# Patient Record
Sex: Male | Born: 1960 | Race: White | Hispanic: No | Smoking: Former smoker
Health system: Southern US, Community
[De-identification: ages and names within clinical notes are randomized; demographics above are authoritative.]

## PROBLEM LIST (undated history)

## (undated) ENCOUNTER — Ambulatory Visit

## (undated) DIAGNOSIS — K759 Inflammatory liver disease, unspecified: Secondary | ICD-10-CM

## (undated) DIAGNOSIS — R079 Chest pain, unspecified: Secondary | ICD-10-CM

## (undated) DIAGNOSIS — E785 Hyperlipidemia, unspecified: Secondary | ICD-10-CM

## (undated) DIAGNOSIS — J189 Pneumonia, unspecified organism: Secondary | ICD-10-CM

## (undated) DIAGNOSIS — C9210 Chronic myeloid leukemia, BCR/ABL-positive, not having achieved remission: Secondary | ICD-10-CM

## (undated) DIAGNOSIS — I10 Essential (primary) hypertension: Secondary | ICD-10-CM

## (undated) DIAGNOSIS — N182 Chronic kidney disease, stage 2 (mild): Secondary | ICD-10-CM

## (undated) DIAGNOSIS — I2510 Atherosclerotic heart disease of native coronary artery without angina pectoris: Secondary | ICD-10-CM

## (undated) HISTORY — PX: OTHER SURGICAL HISTORY: SHX169

## (undated) HISTORY — DX: Atherosclerotic heart disease of native coronary artery without angina pectoris: I25.10

---

## 2002-06-12 ENCOUNTER — Ambulatory Visit (HOSPITAL_COMMUNITY): Admission: RE | Admit: 2002-06-12 | Discharge: 2002-06-12 | Payer: Self-pay | Admitting: Orthopedic Surgery

## 2002-06-12 ENCOUNTER — Encounter: Payer: Self-pay | Admitting: Orthopedic Surgery

## 2002-07-30 ENCOUNTER — Encounter: Admission: RE | Admit: 2002-07-30 | Discharge: 2002-07-30 | Payer: Self-pay | Admitting: Orthopedic Surgery

## 2002-07-30 ENCOUNTER — Encounter: Payer: Self-pay | Admitting: Orthopedic Surgery

## 2006-02-26 ENCOUNTER — Ambulatory Visit: Payer: Self-pay | Admitting: Internal Medicine

## 2006-02-26 ENCOUNTER — Observation Stay (HOSPITAL_COMMUNITY): Admission: EM | Admit: 2006-02-26 | Discharge: 2006-02-27 | Payer: Self-pay | Admitting: Emergency Medicine

## 2010-04-13 ENCOUNTER — Other Ambulatory Visit: Payer: Self-pay | Admitting: Oncology

## 2010-04-13 ENCOUNTER — Other Ambulatory Visit (HOSPITAL_COMMUNITY)
Admission: RE | Admit: 2010-04-13 | Discharge: 2010-04-13 | Disposition: A | Discharge status: Home / Self Care | Payer: BC Managed Care – PPO | Source: Ambulatory Visit | Attending: Oncology | Admitting: Oncology

## 2010-04-13 DIAGNOSIS — D72829 Elevated white blood cell count, unspecified: Secondary | ICD-10-CM | POA: Insufficient documentation

## 2012-01-30 ENCOUNTER — Emergency Department (HOSPITAL_COMMUNITY)
Admission: EM | Admit: 2012-01-30 | Discharge: 2012-01-30 | Disposition: A | Discharge status: Home / Self Care | Payer: 59 | Attending: Emergency Medicine | Admitting: Emergency Medicine

## 2012-01-30 ENCOUNTER — Emergency Department (HOSPITAL_COMMUNITY): Payer: 59

## 2012-01-30 ENCOUNTER — Encounter (HOSPITAL_COMMUNITY): Payer: Self-pay | Admitting: Emergency Medicine

## 2012-01-30 DIAGNOSIS — R0602 Shortness of breath: Secondary | ICD-10-CM | POA: Insufficient documentation

## 2012-01-30 DIAGNOSIS — R5381 Other malaise: Secondary | ICD-10-CM | POA: Insufficient documentation

## 2012-01-30 DIAGNOSIS — C9210 Chronic myeloid leukemia, BCR/ABL-positive, not having achieved remission: Secondary | ICD-10-CM | POA: Insufficient documentation

## 2012-01-30 DIAGNOSIS — E7800 Pure hypercholesterolemia, unspecified: Secondary | ICD-10-CM | POA: Insufficient documentation

## 2012-01-30 DIAGNOSIS — R079 Chest pain, unspecified: Secondary | ICD-10-CM

## 2012-01-30 DIAGNOSIS — I10 Essential (primary) hypertension: Secondary | ICD-10-CM | POA: Insufficient documentation

## 2012-01-30 DIAGNOSIS — R072 Precordial pain: Secondary | ICD-10-CM

## 2012-01-30 DIAGNOSIS — Z79899 Other long term (current) drug therapy: Secondary | ICD-10-CM | POA: Insufficient documentation

## 2012-01-30 HISTORY — DX: Chronic myeloid leukemia, BCR/ABL-positive, not having achieved remission: C92.10

## 2012-01-30 HISTORY — DX: Hyperlipidemia, unspecified: E78.5

## 2012-01-30 HISTORY — DX: Chest pain, unspecified: R07.9

## 2012-01-30 HISTORY — DX: Essential (primary) hypertension: I10

## 2012-01-30 LAB — POCT I-STAT TROPONIN I: Troponin i, poc: 0 ng/mL (ref 0.00–0.08)

## 2012-01-30 LAB — CBC WITH DIFFERENTIAL/PLATELET
Basophils Absolute: 0.1 10*3/uL (ref 0.0–0.1)
Basophils Relative: 1 % (ref 0–1)
Eosinophils Absolute: 0.3 10*3/uL (ref 0.0–0.7)
Eosinophils Relative: 4 % (ref 0–5)
HCT: 45 % (ref 39.0–52.0)
Hemoglobin: 15.7 g/dL (ref 13.0–17.0)
Lymphocytes Relative: 32 % (ref 12–46)
Lymphs Abs: 2.2 K/uL (ref 0.7–4.0)
MCH: 29.8 pg (ref 26.0–34.0)
MCHC: 34.9 g/dL (ref 30.0–36.0)
MCV: 85.4 fL (ref 78.0–100.0)
Monocytes Absolute: 0.7 K/uL (ref 0.1–1.0)
Monocytes Relative: 10 % (ref 3–12)
Neutro Abs: 3.7 K/uL (ref 1.7–7.7)
Neutrophils Relative %: 53 % (ref 43–77)
Platelets: 146 10*3/uL — ABNORMAL LOW (ref 150–400)
RBC: 5.27 MIL/uL (ref 4.22–5.81)
RDW: 14.7 % (ref 11.5–15.5)
WBC: 6.9 K/uL (ref 4.0–10.5)

## 2012-01-30 LAB — COMPREHENSIVE METABOLIC PANEL WITH GFR
Albumin: 4.4 g/dL (ref 3.5–5.2)
Alkaline Phosphatase: 73 U/L (ref 39–117)
BUN: 16 mg/dL (ref 6–23)
CO2: 25 meq/L (ref 19–32)
Chloride: 106 meq/L (ref 96–112)
Creatinine, Ser: 1.13 mg/dL (ref 0.50–1.35)
GFR calc non Af Amer: 73 mL/min — ABNORMAL LOW (ref >90)
Potassium: 3.7 meq/L (ref 3.5–5.1)
Total Bilirubin: 0.6 mg/dL (ref 0.3–1.2)

## 2012-01-30 LAB — COMPREHENSIVE METABOLIC PANEL
ALT: 25 U/L (ref 0–53)
AST: 32 U/L (ref 0–37)
Calcium: 9.5 mg/dL (ref 8.4–10.5)
GFR calc Af Amer: 84 mL/min — ABNORMAL LOW (ref >90)
Glucose, Bld: 91 mg/dL (ref 70–99)
Sodium: 140 mEq/L (ref 135–145)
Total Protein: 7.5 g/dL (ref 6.0–8.3)

## 2012-01-30 MED ORDER — POTASSIUM CHLORIDE ER 10 MEQ PO TBCR: 20.0000 meq | EXTENDED_RELEASE_TABLET | Freq: Every day | ORAL | Status: DC

## 2012-01-30 MED ORDER — HYDROCHLOROTHIAZIDE 25 MG PO TABS: 25.0000 mg | ORAL_TABLET | Freq: Every day | ORAL | Status: DC

## 2012-01-30 MED ORDER — ASPIRIN 81 MG PO CHEW
CHEWABLE_TABLET | ORAL | Status: AC
  Filled 2012-01-30: qty 1

## 2012-01-30 MED ORDER — ASPIRIN 81 MG PO CHEW
324.0000 mg | CHEWABLE_TABLET | Freq: Once | ORAL | Status: AC
  Administered 2012-01-30: 324 mg via ORAL
  Filled 2012-01-30: qty 4

## 2012-01-30 NOTE — ED Notes (Signed)
Correction to departure vitals--blood pressure done on left arm while sitting
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
## 2012-01-30 NOTE — ED Provider Notes (Signed)
History     CSN: [id number]  Arrival date & time 01/30/12  1047   First MD Initiated Contact with Patient 01/30/12 1133      Chief Complaint  Patient presents with  . Chest Pain    (Consider location/radiation/quality/duration/timing/severity/associated sxs/prior treatment) HPI Comments: Patient with h/o CML for 2 years (on dasatinib), cath in 2008 with no stents placed -- presents with complaints of high blood pressure for the past 4 days with associated shortness of breath, fatigue, chest pain. Chest pain is intermittent lasting from 1 to 4 minutes however there is a pressure that is more persistent. Pain/pressure does not radiate. It is not associated with activity or movement. Patient states that he has mild shortness of breath with activity and also generalized fatigue. No fever, N/V/D, cough, abdominal pain. No treatments PTA. Onset gradual, course constant. Nothing makes symptoms better or worse.   Patient is a 51 y.o. male presenting with chest pain. The history is provided by the patient.  Chest Pain Primary symptoms include shortness of breath. Pertinent negatives for primary symptoms include no fever, no cough, no palpitations, no abdominal pain, no nausea and no vomiting.  Pertinent negatives for associated symptoms include no diaphoresis.     Past Medical History  Diagnosis Date  . Hypertension   . Leukemia   . High cholesterol     History reviewed. No pertinent past surgical history.  No family history on file.  History  Substance Use Topics  . Smoking status: Never Smoker   . Smokeless tobacco: Not on file  . Alcohol Use: Not on file      Review of Systems  Constitutional: Negative for fever and diaphoresis.  HENT: Negative for neck pain.   Eyes: Negative for redness.  Respiratory: Positive for shortness of breath. Negative for cough.   Cardiovascular: Positive for chest pain. Negative for palpitations and leg swelling.  Gastrointestinal: Negative for  nausea, vomiting and abdominal pain.  Genitourinary: Negative for dysuria.  Musculoskeletal: Negative for back pain.  Skin: Negative for rash.  Neurological: Negative for syncope and light-headedness.    Allergies  Review of patient's allergies indicates no known allergies.  Home Medications   Current Outpatient Rx  Name  Route  Sig  Dispense  Refill  . ATENOLOL 25 MG PO TABS   Oral   Take 25 mg by mouth daily.         Marland Kitchen DASATINIB 100 MG PO TABS   Oral   Take 100 mg by mouth daily.         Marland Kitchen PRAVASTATIN SODIUM 20 MG PO TABS   Oral   Take 20 mg by mouth daily.           BP 157/116  Pulse 69  Temp 98.1 F (36.7 C)  Resp 20  SpO2 99%  Physical Exam  Nursing note and vitals reviewed. Constitutional: He appears well-developed and well-nourished.  HENT:  Head: Normocephalic and atraumatic.  Mouth/Throat: Mucous membranes are normal. Mucous membranes are not dry.  Eyes: Conjunctivae normal are normal.  Neck: Trachea normal and normal range of motion. Neck supple. Normal carotid pulses and no JVD present. No muscular tenderness present. Carotid bruit is not present. No tracheal deviation present.  Cardiovascular: Normal rate, regular rhythm, S1 normal, S2 normal, normal heart sounds and intact distal pulses.  Exam reveals no distant heart sounds and no decreased pulses.   No murmur heard. Pulmonary/Chest: Effort normal and breath sounds normal. No respiratory distress. He has no  wheezes. He exhibits no tenderness.  Abdominal: Soft. Normal aorta and bowel sounds are normal. There is no tenderness. There is no rebound and no guarding.  Musculoskeletal: He exhibits no edema.  Neurological: He is alert.  Skin: Skin is warm and dry. He is not diaphoretic. No cyanosis. No pallor.  Psychiatric: He has a normal mood and affect.    ED Course  Procedures (including critical care time)  Labs Reviewed  CBC WITH DIFFERENTIAL - Abnormal; Notable for the following:     Platelets 146 (*)     All other components within normal limits  COMPREHENSIVE METABOLIC PANEL - Abnormal; Notable for the following:    GFR calc non Af Amer 73 (*)     GFR calc Af Amer 84 (*)     All other components within normal limits  POCT I-STAT TROPONIN I   Dg Chest 2 View  01/30/2012  *RADIOLOGY REPORT*  Clinical Data: Shortness of breath for 5 days  CHEST - 2 VIEW  Comparison: 12/16/2010  Findings: The cardiac silhouette is normal in size and configuration.  The aorta is mildly uncoiled.  No mediastinal or hilar masses or adenopathy.  The lungs are clear.  The bony thorax is intact.  IMPRESSION: No active disease of the chest.   Original Report Authenticated By: Amie Portland, M.D.      1. Chest pain     11:45 AM Patient seen and examined. Work-up initiated. Medications ordered.   Vital signs reviewed and are as follows: Filed Vitals:   01/30/12 1113  BP: 157/116  Pulse: 69  Temp: 98.1 F (36.7 C)  Resp: 20   1:25 PM Patient discussed with and seen by Dr. Oletta Lamas -- will call for cardiology consult.   [place] has seen and scheduled Myoview test tomorrow. Will d/c to home.   3:52 PM Patient was counseled to return with severe chest pain, especially if the pain is crushing or pressure-like and spreads to the arms, back, neck, or jaw, or if they have sweating, nausea, or shortness of breath with the pain. They were encouraged to call 911 with these symptoms.   They were also told to return if their chest pain gets worse and does not go away with rest, they have an attack of chest pain lasting longer than usual despite rest and treatment with the medications their caregiver has prescribed, if they wake from sleep with chest pain or shortness of breath, if they feel dizzy or faint, if they have chest pain not typical of their usual pain, or if they have any other emergent concerns regarding their health.  The patient verbalized understanding and agreed.     MDM  CP - [place]  has seen and set up Myoview tomorrow.         Renne Crigler, [place] 01/30/12 [phone]

## 2012-01-30 NOTE — ED Notes (Signed)
Dr. Oletta Lamas and Felicita Gage, PA at bedside.

## 2012-01-30 NOTE — ED Notes (Signed)
Cardiology at bedside.

## 2012-01-30 NOTE — ED Notes (Signed)
Pt denies chest pain; pt states a little shortness of breath; pt states chest pressure 1/10.

## 2012-01-30 NOTE — ED Notes (Signed)
Pt ambulatory leaving ED with prescriptions and d/c instructions; pt has no further questions upon d/c. Pt does not appear to be in any acute distress upon d/c

## 2012-01-30 NOTE — ED Notes (Signed)
Tightness in chest andsob has beenhaving high bp  For the past 4 days
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
## 2012-01-30 NOTE — ED Notes (Signed)
Pt denies chest pain and pressure/tightness. Pt denies shortness of breath. Pt denies nausea, lightheadedness and dizziness.

## 2012-01-30 NOTE — ED Notes (Signed)
Pt denies chest pain and chest pressure. Pt denies shortness of breath.

## 2012-01-30 NOTE — ED Notes (Signed)
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 

## 2012-01-30 NOTE — ED Notes (Signed)
Pt currently denies chest pain and chest pressure. Pt denies shortness of breath.
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
## 2012-01-30 NOTE — ED Provider Notes (Signed)
Medical screening examination/treatment/procedure(s) were conducted as a shared visit with non-physician practitioner(s) and myself.  I personally evaluated the patient during the encounter   Pt with h/o cath 7 years ago, reports verbally that a small artery was blocked, but too small to intervene on, has not followed up with a cardiologist since.  Pt with last 4 days, has CP and SOB, mild nausea and weakness due to CP that can be tightness, lasting minutes to about 1 hour, will come on with exertion, but at times at rest as well.  No pain now.  ECG shows no STEMI.  Troponin is neg, asa given.  Will consult [place] cardiology to see, possibly admit for functional study.  Pt with risk factors of HTN, hyperlipidemia.      Gavin Pound. Rochelle Nephew, MD 01/30/12 [phone]

## 2012-01-31 ENCOUNTER — Ambulatory Visit (HOSPITAL_COMMUNITY): Payer: 59 | Attending: Cardiology | Admitting: Radiology

## 2012-01-31 VITALS — BP 118/81 | Ht 71.0 in | Wt 210.0 lb

## 2012-01-31 DIAGNOSIS — R079 Chest pain, unspecified: Secondary | ICD-10-CM | POA: Insufficient documentation

## 2012-01-31 DIAGNOSIS — R110 Nausea: Secondary | ICD-10-CM | POA: Insufficient documentation

## 2012-01-31 DIAGNOSIS — I2510 Atherosclerotic heart disease of native coronary artery without angina pectoris: Secondary | ICD-10-CM

## 2012-01-31 DIAGNOSIS — R0602 Shortness of breath: Secondary | ICD-10-CM | POA: Insufficient documentation

## 2012-01-31 MED ORDER — TECHNETIUM TC 99M SESTAMIBI GENERIC - CARDIOLITE
30.0000 | Freq: Once | INTRAVENOUS | Status: AC | PRN
  Administered 2012-01-31: 30 via INTRAVENOUS

## 2012-01-31 MED ORDER — TECHNETIUM TC 99M SESTAMIBI GENERIC - CARDIOLITE
10.0000 | Freq: Once | INTRAVENOUS | Status: AC | PRN
  Administered 2012-01-31: 10 via INTRAVENOUS
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
## 2012-02-14 ENCOUNTER — Encounter: Payer: Self-pay | Admitting: *Deleted

## 2012-02-15 ENCOUNTER — Encounter: Payer: Self-pay | Admitting: Physician Assistant

## 2012-02-15 ENCOUNTER — Encounter (HOSPITAL_COMMUNITY): Payer: Self-pay | Admitting: General Practice

## 2012-02-15 ENCOUNTER — Observation Stay (HOSPITAL_COMMUNITY)
Admission: AD | Admit: 2012-02-15 | Discharge: 2012-02-16 | Disposition: A | Discharge status: Home / Self Care | Payer: 59 | Source: Ambulatory Visit | Attending: Cardiology | Admitting: Cardiology

## 2012-02-15 ENCOUNTER — Ambulatory Visit (INDEPENDENT_AMBULATORY_CARE_PROVIDER_SITE_OTHER): Payer: 59 | Admitting: Physician Assistant

## 2012-02-15 VITALS — BP 136/84 | HR 63 | Ht 71.0 in | Wt 215.0 lb

## 2012-02-15 DIAGNOSIS — I200 Unstable angina: Secondary | ICD-10-CM

## 2012-02-15 DIAGNOSIS — R072 Precordial pain: Secondary | ICD-10-CM | POA: Diagnosis present

## 2012-02-15 DIAGNOSIS — R079 Chest pain, unspecified: Secondary | ICD-10-CM

## 2012-02-15 DIAGNOSIS — E785 Hyperlipidemia, unspecified: Secondary | ICD-10-CM | POA: Insufficient documentation

## 2012-02-15 DIAGNOSIS — I10 Essential (primary) hypertension: Secondary | ICD-10-CM

## 2012-02-15 DIAGNOSIS — C9210 Chronic myeloid leukemia, BCR/ABL-positive, not having achieved remission: Secondary | ICD-10-CM

## 2012-02-15 DIAGNOSIS — I2510 Atherosclerotic heart disease of native coronary artery without angina pectoris: Secondary | ICD-10-CM

## 2012-02-15 DIAGNOSIS — I25118 Atherosclerotic heart disease of native coronary artery with other forms of angina pectoris: Secondary | ICD-10-CM

## 2012-02-15 DIAGNOSIS — R9439 Abnormal result of other cardiovascular function study: Secondary | ICD-10-CM | POA: Insufficient documentation

## 2012-02-15 HISTORY — DX: Pneumonia, unspecified organism: J18.9

## 2012-02-15 HISTORY — DX: Inflammatory liver disease, unspecified: K75.9

## 2012-02-15 LAB — COMPREHENSIVE METABOLIC PANEL
ALT: 31 U/L (ref 0–53)
AST: 37 U/L (ref 0–37)
Albumin: 4.4 g/dL (ref 3.5–5.2)
Calcium: 9.9 mg/dL (ref 8.4–10.5)
Creatinine, Ser: 1.24 mg/dL (ref 0.50–1.35)
GFR calc non Af Amer: 65 mL/min — ABNORMAL LOW (ref >90)
Sodium: 140 mEq/L (ref 135–145)
Total Protein: 7.5 g/dL (ref 6.0–8.3)

## 2012-02-15 LAB — CBC WITH DIFFERENTIAL/PLATELET
Basophils Absolute: 0.1 10*3/uL (ref 0.0–0.1)
Basophils Relative: 1 % (ref 0–1)
Eosinophils Absolute: 0.3 10*3/uL (ref 0.0–0.7)
Eosinophils Relative: 5 % (ref 0–5)
HCT: 43.2 % (ref 39.0–52.0)
Lymphocytes Relative: 28 % (ref 12–46)
MCHC: 36.1 g/dL — ABNORMAL HIGH (ref 30.0–36.0)
MCV: 85 fL (ref 78.0–100.0)
Monocytes Absolute: 0.5 10*3/uL (ref 0.1–1.0)
Platelets: 185 10*3/uL (ref 150–400)
RDW: 14.3 % (ref 11.5–15.5)
WBC: 6.3 10*3/uL (ref 4.0–10.5)

## 2012-02-15 LAB — TROPONIN I: Troponin I: <0.3 ng/mL (ref <0.30)

## 2012-02-15 MED ORDER — HYDROCHLOROTHIAZIDE 25 MG PO TABS
25.0000 mg | ORAL_TABLET | Freq: Every day | ORAL | Status: DC
  Filled 2012-02-15 (×2): qty 1

## 2012-02-15 MED ORDER — NITROGLYCERIN 0.4 MG SL SUBL: 0.4000 mg | SUBLINGUAL_TABLET | SUBLINGUAL | Status: DC | PRN

## 2012-02-15 MED ORDER — ASPIRIN 81 MG PO CHEW: 81.0000 mg | CHEWABLE_TABLET | Freq: Every day | ORAL | Status: DC

## 2012-02-15 MED ORDER — SODIUM CHLORIDE 0.9 % IJ SOLN: 3.0000 mL | INTRAMUSCULAR | Status: DC | PRN

## 2012-02-15 MED ORDER — SODIUM CHLORIDE 0.9 % IJ SOLN
3.0000 mL | Freq: Two times a day (BID) | INTRAMUSCULAR | Status: DC
  Administered 2012-02-15: 3 mL via INTRAVENOUS

## 2012-02-15 MED ORDER — ASPIRIN 81 MG PO CHEW
324.0000 mg | CHEWABLE_TABLET | ORAL | Status: AC
  Administered 2012-02-16: 324 mg via ORAL
  Filled 2012-02-15: qty 4

## 2012-02-15 MED ORDER — SODIUM CHLORIDE 0.9 % IJ SOLN: 3.0000 mL | Freq: Two times a day (BID) | INTRAMUSCULAR | Status: DC

## 2012-02-15 MED ORDER — DASATINIB 100 MG PO TABS: 100.0000 mg | ORAL_TABLET | Freq: Every day | ORAL | Status: DC

## 2012-02-15 MED ORDER — SIMVASTATIN 10 MG PO TABS
10.0000 mg | ORAL_TABLET | Freq: Every day | ORAL | Status: DC
  Administered 2012-02-15: 10 mg via ORAL
  Filled 2012-02-15 (×2): qty 1

## 2012-02-15 MED ORDER — ALPRAZOLAM 0.25 MG PO TABS
0.2500 mg | ORAL_TABLET | Freq: Three times a day (TID) | ORAL | Status: DC | PRN
  Administered 2012-02-15: 0.25 mg via ORAL
  Filled 2012-02-15: qty 1

## 2012-02-15 MED ORDER — ATENOLOL 25 MG PO TABS
25.0000 mg | ORAL_TABLET | Freq: Every day | ORAL | Status: DC
  Filled 2012-02-15 (×2): qty 1

## 2012-02-15 MED ORDER — ACETAMINOPHEN 325 MG PO TABS: 650.0000 mg | ORAL_TABLET | ORAL | Status: DC | PRN

## 2012-02-15 MED ORDER — SODIUM CHLORIDE 0.9 % IV SOLN
INTRAVENOUS | Status: DC
  Administered 2012-02-15: 12:00:00 via INTRAVENOUS

## 2012-02-15 MED ORDER — ONDANSETRON HCL 4 MG/2ML IJ SOLN: 4.0000 mg | Freq: Four times a day (QID) | INTRAMUSCULAR | Status: DC | PRN

## 2012-02-15 MED ORDER — SODIUM CHLORIDE 0.9 % IV SOLN
INTRAVENOUS | Status: DC
  Administered 2012-02-16: 02:00:00 via INTRAVENOUS

## 2012-02-15 MED ORDER — POTASSIUM CHLORIDE ER 10 MEQ PO TBCR
20.0000 meq | EXTENDED_RELEASE_TABLET | Freq: Every day | ORAL | Status: DC
  Filled 2012-02-15 (×2): qty 2

## 2012-02-15 MED ORDER — ZOLPIDEM TARTRATE 5 MG PO TABS: 5.0000 mg | ORAL_TABLET | Freq: Every evening | ORAL | Status: DC | PRN

## 2012-02-15 MED ORDER — SODIUM CHLORIDE 0.9 % IV SOLN: 250.0000 mL | INTRAVENOUS | Status: DC | PRN

## 2012-02-15 MED ORDER — ASPIRIN 81 MG PO CHEW
324.0000 mg | CHEWABLE_TABLET | ORAL | Status: AC
  Administered 2012-02-15: 324 mg via ORAL
  Filled 2012-02-15: qty 4

## 2012-02-15 NOTE — Patient Instructions (Signed)
Your physician has requested that you have a LEFT HEART cardiac catheterization WITH DR. Riley Kill TODAY 02/15/12 DX 786.50. Cardiac catheterization is used to diagnose and/or treat various heart conditions. Doctors may recommend this procedure for a number of different reasons. The most common reason is to evaluate chest pain. Chest pain can be a symptom of coronary artery disease (CAD), and cardiac catheterization can show whether plaque is narrowing or blocking your heart's arteries. This procedure is also used to evaluate the valves, as well as measure the blood flow and oxygen levels in different parts of your heart. For further information please visit [email or web address] Please follow instruction sheet, as given.
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
## 2012-02-15 NOTE — H&P (Signed)
Agree with plan for cath today given abnormal myoview and ongoing possible ischemic symptoms.  Will send to [facility].   Marca Ancona 02/15/2012

## 2012-02-15 NOTE — Progress Notes (Signed)
[address], [address] [place], [place]  [address] Phone: [phone], Fax:  [phone]  Date:  02/15/2012   Name:  Steven [name]   DOB:  07/29/1960   MRN:  [id number]  PCP:  No primary provider on file.  Primary Cardiologist:  Seen by Dr. Gala Romney in consultation 01/30/12 Primary Electrophysiologist:  None    History of Present Illness: Steven [name] is a 51 y.o. male who returns for follow up after recent evaluation for chest pain and an abnormal stress test.  He has a hx of HTN, CML, HL. He was evaluated by Dr. Gala Romney on 01/30/12 for chest discomfort. 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 He would have intermittent midsternal chest pressure. He also noted elevated blood pressures. Cardiac markers were normal. Chest x-ray was also normal. HCTZ was prescribed for his hypertension. Patient was discharged from the emergency room and set up for stress test. ETT Myoview was performed 01/31/12. He exercised for 8 minutes 15 seconds. He did have chest tightness. There were nonspecific ST-T wave changes. Nuclear images demonstrated small, mild intensity, reversible inferobasal defect consistent with mild inferior ischemia, EF 59%. Dr. Gala Romney review the study and recommend the patient and he seems to cardiac catheterization.   Patient notes continued chest discomfort since being seen in the emergency room. He describes this as a substernal pressure. He has radiation up into his neck. He notes dyspnea as well as associated nausea and diaphoresis. The pain is intermittent and can come on at rest. Really denies any exertional symptoms. Symptoms sometimes awaken him from sleep. He is actually having some discomfort in the office today.   Labs (11/13):    K 3.7, creatinine 1.13, ALT 25, Hgb 15.7  Wt Readings from Last 3 Encounters:  02/15/12 215 lb (97.523 kg)  01/31/12 210 lb (95.255 kg)      Past Medical History  Diagnosis Date  . Hypertension     a. Dx 2006  . CML (chronic myeloid leukemia)     a. Dx 2011 - followed @ [facility] - remission  . Hyperlipidemia     a. Dx 2006  . Chest pain     a. Cath 2006 - reportedly nonobstructive - was placed on plavix afterwards.;   b.  ETT-MV 11/13:   mild inferior ischemia, EF 59%.    Current Outpatient Prescriptions  Medication Sig Dispense Refill  . atenolol (TENORMIN) 25 MG tablet Take 25 mg by mouth daily.      . dasatinib (SPRYCEL) 100 MG tablet Take 100 mg by mouth daily.      . hydrochlorothiazide (HYDRODIURIL) 25 MG tablet Take 1 tablet (25 mg total) by mouth daily.  30 tablet  2  . potassium chloride (K-DUR) 10 MEQ tablet Take 2 tablets (20 mEq total) by mouth daily.  60 tablet  2  . pravastatin (PRAVACHOL) 20 MG tablet Take 20 mg by mouth daily.        Allergies:  No Known Allergies  Social History:  The patient  reports that he has quit smoking. He does not have any smokeless tobacco history on file. He reports that he drinks alcohol. He reports that he does not use illicit drugs.   Family History:   The patient's family history includes Heart attack in his father and Other in his brother and mother.   ROS:  Please see the history of present illness.  All other systems reviewed and negative.   PHYSICAL EXAM: VS:  BP 136/84  Pulse 63  Ht 5\' 11"  (1.803 m)  Wt 215 lb (97.523 kg)  BMI 29.99 kg/m2 Well nourished, well developed, in no acute distress HEENT: normal Neck: no JVD Vascular:  No carotid bruits Cardiac:  normal S1, S2; RRR; no murmur Lungs:  clear to auscultation bilaterally, no wheezing, rhonchi or rales Abd: soft, nontender, no hepatomegaly Ext: no edema Skin: warm and dry Neuro:  CNs 2-12 intact, no focal abnormalities noted  EKG:  NSR, HR 68, no acute changes      ASSESSMENT AND PLAN:  1. Unstable Angina:   Patient has an abnormal Myoview as well as continued chest discomfort  that sounds suspicious for angina. We will place him in the hospital today under observation status and plan on cardiac catheterization later today. I reviewed his case today with Dr. Shirlee Latch (DOD). The patient has a history of chronic myeloid leukemia and sees oncology in [place]. He is on Sprycel. Aspirin and Plavix may increase bleeding risk in combination with Sprycel. I will review this with Dr. Riley Kill who will likely be the cardiologist who performs his cardiac catheterization today. In light of this drug, I will use SCDs for DVT prophylaxis. If he has obstructive coronary disease, his case will likely need to be discussed with his oncologist.  2. Hypertension:   Controlled.  3. Hyperlipidemia:   Managed by primary care.  Signed, Tereso Newcomer, PA-C  8:31 AM 02/15/2012

## 2012-02-16 ENCOUNTER — Encounter (HOSPITAL_COMMUNITY): Payer: Self-pay | Admitting: Physician Assistant

## 2012-02-16 ENCOUNTER — Ambulatory Visit (HOSPITAL_COMMUNITY): Admit: 2012-02-16 | Payer: Self-pay | Admitting: Cardiovascular Disease

## 2012-02-16 ENCOUNTER — Encounter (HOSPITAL_COMMUNITY)
Admission: AD | Disposition: A | Discharge status: Home / Self Care | Payer: Self-pay | Source: Ambulatory Visit | Attending: Cardiology

## 2012-02-16 DIAGNOSIS — I2510 Atherosclerotic heart disease of native coronary artery without angina pectoris: Secondary | ICD-10-CM

## 2012-02-16 DIAGNOSIS — E785 Hyperlipidemia, unspecified: Secondary | ICD-10-CM

## 2012-02-16 DIAGNOSIS — R079 Chest pain, unspecified: Secondary | ICD-10-CM

## 2012-02-16 DIAGNOSIS — I10 Essential (primary) hypertension: Secondary | ICD-10-CM

## 2012-02-16 DIAGNOSIS — I25118 Atherosclerotic heart disease of native coronary artery with other forms of angina pectoris: Secondary | ICD-10-CM

## 2012-02-16 DIAGNOSIS — C9210 Chronic myeloid leukemia, BCR/ABL-positive, not having achieved remission: Secondary | ICD-10-CM

## 2012-02-16 HISTORY — PX: LEFT HEART CATHETERIZATION WITH CORONARY ANGIOGRAM: SHX5451

## 2012-02-16 HISTORY — PX: CARDIAC CATHETERIZATION: SHX172

## 2012-02-16 LAB — LIPID PANEL
HDL: 36 mg/dL — ABNORMAL LOW (ref >39)
LDL Cholesterol: 113 mg/dL — ABNORMAL HIGH (ref 0–99)
Total CHOL/HDL Ratio: 4.9 RATIO
Triglycerides: 140 mg/dL (ref <150)

## 2012-02-16 LAB — TROPONIN I: Troponin I: <0.3 ng/mL (ref <0.30)

## 2012-02-16 SURGERY — LEFT HEART CATHETERIZATION WITH CORONARY ANGIOGRAM
Anesthesia: LOCAL

## 2012-02-16 MED ORDER — ONDANSETRON HCL 4 MG/2ML IJ SOLN: 4.0000 mg | Freq: Four times a day (QID) | INTRAMUSCULAR | Status: DC | PRN

## 2012-02-16 MED ORDER — SODIUM CHLORIDE 0.9 % IJ SOLN: 3.0000 mL | Freq: Two times a day (BID) | INTRAMUSCULAR | Status: DC

## 2012-02-16 MED ORDER — ASPIRIN EC 325 MG PO TBEC: 325.0000 mg | DELAYED_RELEASE_TABLET | Freq: Every day | ORAL | Status: DC

## 2012-02-16 MED ORDER — DIAZEPAM 2 MG PO TABS: 2.0000 mg | ORAL_TABLET | ORAL | Status: DC | PRN

## 2012-02-16 MED ORDER — LIDOCAINE HCL (PF) 1 % IJ SOLN
INTRAMUSCULAR | Status: AC
  Filled 2012-02-16: qty 30

## 2012-02-16 MED ORDER — NITROGLYCERIN 0.2 MG/ML ON CALL CATH LAB
INTRAVENOUS | Status: AC
  Filled 2012-02-16: qty 1

## 2012-02-16 MED ORDER — OXYCODONE-ACETAMINOPHEN 5-325 MG PO TABS: 1.0000 | ORAL_TABLET | ORAL | Status: DC | PRN

## 2012-02-16 MED ORDER — HEPARIN SODIUM (PORCINE) 1000 UNIT/ML IJ SOLN
INTRAMUSCULAR | Status: AC
  Filled 2012-02-16: qty 1

## 2012-02-16 MED ORDER — SODIUM CHLORIDE 0.9 % IV SOLN: 250.0000 mL | INTRAVENOUS | Status: DC

## 2012-02-16 MED ORDER — ACETAMINOPHEN 325 MG PO TABS: 650.0000 mg | ORAL_TABLET | ORAL | Status: DC | PRN

## 2012-02-16 MED ORDER — VERAPAMIL HCL 2.5 MG/ML IV SOLN
INTRAVENOUS | Status: AC
  Filled 2012-02-16: qty 2

## 2012-02-16 MED ORDER — ASPIRIN 81 MG PO CHEW: 81.0000 mg | CHEWABLE_TABLET | Freq: Every day | ORAL | Status: DC

## 2012-02-16 MED ORDER — MIDAZOLAM HCL 2 MG/2ML IJ SOLN
INTRAMUSCULAR | Status: AC
  Filled 2012-02-16: qty 2

## 2012-02-16 MED ORDER — SODIUM CHLORIDE 0.9 % IJ SOLN: 3.0000 mL | INTRAMUSCULAR | Status: DC | PRN

## 2012-02-16 MED ORDER — SODIUM CHLORIDE 0.9 % IV SOLN: 1.0000 mL/kg/h | INTRAVENOUS | Status: DC

## 2012-02-16 MED ORDER — FENTANYL CITRATE 0.05 MG/ML IJ SOLN
INTRAMUSCULAR | Status: AC
  Filled 2012-02-16: qty 2

## 2012-02-16 MED ORDER — HEPARIN (PORCINE) IN NACL 2-0.9 UNIT/ML-% IJ SOLN
INTRAMUSCULAR | Status: AC
  Filled 2012-02-16: qty 2000

## 2012-02-16 NOTE — Interval H&P Note (Signed)
History and Physical Interval Note:  02/16/2012 7:40 AM  Steven [name]  has presented today for surgery, with the diagnosis of Chest pain  The various methods of treatment have been discussed with the patient and family. After consideration of risks, benefits and other options for treatment, the patient has consented to  Procedure(s) (LRB) with comments: LEFT HEART CATHETERIZATION WITH CORONARY ANGIOGRAM (N/A) as a surgical intervention .  The patient's history has been reviewed, patient examined, no change in status, stable for surgery.  I have reviewed the patient's chart and labs.  Questions were answered to the patient's satisfaction.     Charlton Haws

## 2012-02-16 NOTE — Discharge Summary (Signed)
Discharge Summary   Patient ID: BREION NOVACEK,  MRN: [id number], DOB/AGE: Jul 16, 1960 51 y.o.  Admit date: 02/15/2012 Discharge date: 02/16/2012  Primary Physician: Nonnie Done., MD Primary Cardiologist: assessed by Dr. Gala Romney in consultation 01/30/12   Discharge Diagnoses Principal Problem:  *Substernal chest pain  - cath 02/16/12: 30% tubular prox RCA stenosis, normal coronaries otherwise; LVEF 65%, no WMAs Active Problems:  CAD in native artery  - continue low-dose ASA, statin  Hyperlipidemia  Essential hypertension  CML (chronic myelocytic leukemia)  - follow-up with hem/onc provider re: ASA and concomitant Sprycel treatment   Allergies No Known Allergies  Diagnostic Studies/Procedures  CARDIAC CATHETERIZATION - 02/16/12  Coronary Arteries:  Right dominant with no anomalies  LM: Normal  LAD: normal D1- large vessel and normal D2- normal  Circumflex: Normal  OM1- Normal OM2- Normal  RCA: 30% tubular in proximal normal mid and distal vessel  PDA- Normal PLB- Normal  Ventriculography: EF: 65%, No RWMA;s  Hemodynamics:  Aortic Pressure: 105 68 mmHg LV Pressure: 102 9 mmHg  History of Present Illness  Mr. Fells is a 51yo male with the above problem list who was sent to [facility] hospital yesterday to undergo diagnostic cardiac cath. He was initially evaluated by Dr. 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 He effectively ruled out and was scheduled for outpatient stress testing. He experienced chest discomfort with this, non-specific ST/T changes and small, mild intesnity, reversible inferobasal defect c/w mild inferior ischemia and preserved EF. He was assessed by Tereso Newcomer in the office yesterday where he continued to endorse constant chest discomfort. Given these findings, he was sent to [facility] to undergo diagnostic cardiac  cath.   Hospital Course   There were no overnight events. He was informed, consented and prepped for cath which is outlined in full above revealing one 30% tubular prox RCA lesion, otherwise normal coronaries; LVEF 65%. The recommendation was made to consider alternative chest pain etiologies. Dr. Eden Emms deemed the patient stable for discharge today. He will be continued on all prior outpatient medications. There is documented effects of antiplatelets agents including ASA increasing bleeding risk with concomitant Sprycel use (immune modulator for CML). After discussing with Dr. Shirlee Latch, will plan to continue low-dose ASA and follow-up with this hematologist/oncologist for further recommendations on continued use. Follow-up has been made as noted below. This information, including post-cath instructions, has been clearly outlined in the discharge AVS.    Discharge Vitals:  Blood pressure 123/77, pulse 78, temperature 97.4 F (36.3 C), temperature source Oral, resp. rate 18, height 5\' 11"  (1.803 m), weight 94.983 kg (209 lb 6.4 oz), SpO2 98.00%.   Labs: Recent Labs  [facility] 02/15/12 1123   WBC 6.3   HGB 15.6   HCT 43.2   MCV 85.0   PLT 185    Lab 02/15/12 1123  NA 140  K 4.0  CL 103  CO2 25  BUN 21  CREATININE 1.24  CALCIUM 9.9  PROT 7.5  BILITOT 0.4  ALKPHOS 73  ALT 31  AST 37  AMYLASE --  LIPASE --  GLUCOSE 90   Recent Labs  Basename 02/16/12 0009 02/15/12 1648 02/15/12 1122   CKTOTAL -- -- --   CKMB -- -- --   CKMBINDEX -- -- --   TROPONINI <0.30 <0.30 <0.30   Recent Labs  Basename 02/16/12 0520   CHOL 177   HDL 36*  LDLCALC 113*   TRIG 140   CHOLHDL 4.9   LDLDIRECT --   Disposition:  Discharge Orders    Future Appointments: Provider: Department: Dept Phone: Center:   03/01/2012 9:50 AM Beatrice Lecher, PA [facility] Main Office [place]) [phone] [id number]     Future Orders Please Complete By Expires   Diet - low sodium heart healthy       Increase activity slowly        Follow-up Information    Follow up with Tereso Newcomer, PA. On 03/01/2012. (At 9:50 AM for follow-up. )    Contact information:   [address] [place] [address] [phone]        Discharge Medications:    Medication List     As of 02/16/2012  9:05 AM    START taking these medications         aspirin 81 MG chewable tablet   Chew 1 tablet (81 mg total) by mouth daily.      CONTINUE taking these medications         atenolol 25 MG tablet   Commonly known as: TENORMIN      hydrochlorothiazide 25 MG tablet   Commonly known as: HYDRODIURIL   Take 1 tablet (25 mg total) by mouth daily.      potassium chloride 10 MEQ tablet   Commonly known as: K-DUR   Take 2 tablets (20 mEq total) by mouth daily.      pravastatin 20 MG tablet   Commonly known as: PRAVACHOL      SPRYCEL 100 MG tablet   Generic drug: dasatinib          Where to get your medications       Information on where to get these meds is not yet available. Ask your nurse or doctor.         aspirin 81 MG chewable tablet           Outstanding Labs/Studies: None  Duration of Discharge Encounter: Greater than 30 minutes including physician time.  Signed, R. Hurman Horn, PA-C 02/16/2012, 9:05 AM

## 2012-02-16 NOTE — CV Procedure (Signed)
Catheterization  Name: Steven [name] MRN: [id number] DOB: 22-Sep-1960  Indication: Chest Pain  Procedure:  After informed consent and clinical "time out" the right radial artery was prepped and draped in a sterile fashion. Allen's test was documented to be normal both by manual compression and plethosmography prior to cannulation.  A 5Fr hydrophilic sheath was advanced using a .025 nitinol wire. Catheters were advanced into the ascending aortic root with a .035 J wire and fluoroscopic guidance.  Standard JL3.5, and JR4 catheters were used to engage the coronaries.  A standard angled pigtail catheter was used for ventriculography.  Coronary arteries were visualized in orthogonal views using caudal and cranial angulation.  RAO ventriculography was done using 24 cc of contrast.    Medications:   Versed: 3 mg's  Fentanyl: 25 ug's  Heparin: 4000 units  Verapamil: 3 mg's  Coronary Arteries:  Right dominant with no anomalies  LM: Normal  LAD: normal  D1- large vessel and normal  D2- normal  Circumflex:  Normal  OM1- Normal  OM2- Normal  RCA: 30% tubular in proximal normal mid and distal vessel  PDA- Normal  PLB- Normal  Ventriculography: EF: 65%,  No RWMA;s  Hemodynamics:  Aortic Pressure: 105 68 mmHg  LV Pressure: 102 9  mmHg  Impression:  No significant CAD  D/C home latter today  At the end of the case a TR band was placed with good hemostasis and flow to the hand including the radial aspect of the thumb.

## 2012-03-01 ENCOUNTER — Encounter: Payer: 59 | Admitting: Physician Assistant

## 2012-03-05 ENCOUNTER — Encounter: Payer: 59 | Admitting: Physician Assistant

## 2012-05-10 ENCOUNTER — Other Ambulatory Visit: Payer: Self-pay | Admitting: *Deleted

## 2012-05-10 MED ORDER — POTASSIUM CHLORIDE ER 10 MEQ PO TBCR: 20.0000 meq | EXTENDED_RELEASE_TABLET | Freq: Every day | ORAL | Status: DC

## 2012-06-11 ENCOUNTER — Other Ambulatory Visit: Payer: Self-pay | Admitting: *Deleted

## 2012-06-11 MED ORDER — HYDROCHLOROTHIAZIDE 25 MG PO TABS: 25.0000 mg | ORAL_TABLET | Freq: Every day | ORAL | Status: DC

## 2014-02-19 ENCOUNTER — Encounter (HOSPITAL_COMMUNITY): Payer: Self-pay | Admitting: Cardiovascular Disease

## 2014-08-12 ENCOUNTER — Ambulatory Visit (INDEPENDENT_AMBULATORY_CARE_PROVIDER_SITE_OTHER): Payer: Worker's Compensation | Admitting: Family Medicine

## 2014-08-12 ENCOUNTER — Encounter: Payer: Self-pay | Admitting: Family Medicine

## 2014-08-12 VITALS — BP 120/84 | HR 67 | Temp 98.3°F | Resp 17 | Ht 70.5 in | Wt 222.4 lb

## 2014-08-12 DIAGNOSIS — M25521 Pain in right elbow: Secondary | ICD-10-CM

## 2014-08-12 DIAGNOSIS — S51011A Laceration without foreign body of right elbow, initial encounter: Secondary | ICD-10-CM | POA: Diagnosis not present

## 2014-08-12 NOTE — Progress Notes (Signed)
Verbal consent obtained from patient.  Local anesthesia with 5cc 1% lido with epi.  Wound scrubbed with soap and water and rinsed.  Wound closed with #9 5-0 ethilon simple interuppted sutures.  Wound cleansed and dressed.

## 2014-08-12 NOTE — Progress Notes (Addendum)
Steven [name] 04-Aug-1960 54 y.o.   Chief Complaint  Patient presents with  . Extremity Laceration    injury is located near elbow     Date of Injury: today- 08/11/13  History of Present Illness:  Presents for evaluation of work-related complaint. He was working today on a ladder and slipped- cut his right elbow on a metal light fixture.  He is not sure of the date of his last tetanus- likely at least 6-7 years ago He is otherwise unhurt.   History of leukemia but he is in full remission and doing well in this regard  ROS   No Known Allergies   Current medications reviewed and updated. Past medical history, family history, social history have been reviewed and updated.   Physical Exam  GEN: WDWN, NAD, Non-toxic, A & O x 3, obese, looks well HEENT: Atraumatic, Normocephalic. Neck supple. No masses, No LAD. Ears and Nose: No external deformity. CV: RRR, No M/G/R. No JVD. No thrill. No extra heart sounds. PULM: CTA B, no wheezes, crackles, rhonchi. No retractions. No resp. distress. No accessory muscle use. EXTR: No c/c/e NEURO Normal gait.  PSYCH: Normally interactive. Conversant. Not depressed or anxious appearing.  Calm demeanor.  Right elbow: he has a C shaped laceration on the medial right elbow proximal to the point of the elbow. It is just superior to the medial epicondyle.   It does not involve deep structures.  Normal strength, sensation of hand and arm distal to wound, normal function of arm.  Normal cap refill. The wound is tender with ROM of elbow but otherwise negative  Assessment and Plan:  Laceration of right elbow, initial encounter - Plan: Td vaccine greater than or equal to 7yo preservative free IM  Pain in right elbow  Repaired wound as per note by Araceli Bouche, PA-C.  Tetanus shot today Follow-up for SR, sooner if needed  Did not put on work restrictions but he is not to overdue exertion with his right arm.  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  He has noted some partial numbness of the right 4th and 5th fingers and the ulnar aspect of his hand. He noticed this after his procedure was over and he was driving back to work.  Of note he waited about 20 minutes after his procedure prior to leaving clinic.  I suspect he has swelling that is irritating the ulnar nerve.  He is still able to feel his fingefs, but the sensation is decreased.  He does not have any pain.  Offered to see him for a recheck now, or to follow-up with him tomorrow.  Reassured him that I suspect this will resolve but if it persists will refer to see hand surgery asap.  I will call and check on him tomorrow

## 2014-08-12 NOTE — Patient Instructions (Signed)
WOUND CARE  Please return in 10 days to have your stitches/staples removed or sooner if you have concerns. Marland Kitchen Keep area clean and dry for 24 hours. Do not remove bandage, if applied. . After 24 hours, remove bandage and wash wound gently with mild soap and warm water. Reapply a new bandage after cleaning wound, if directed. . Continue daily cleansing with soap and water until stitches/staples are removed. . Do not apply any ointments or creams to the wound while stitches/staples are in place, as this may cause delayed healing. . Notify the office if you experience any of the following signs of infection: Swelling, redness, pus drainage, streaking, fever >101.0 F . Notify the office if you experience excessive bleeding that does not stop after 15-20 minutes of constant, firm Pressure.  Please keep your wound clean and covered at work

## 2014-08-13 ENCOUNTER — Telehealth: Payer: Self-pay | Admitting: Family Medicine

## 2014-08-13 NOTE — Telephone Encounter (Signed)
Called and LMOM on his cell- checking on his hand.  Please call me back when he can

## 2014-08-13 NOTE — Telephone Encounter (Signed)
Spoke with pt- he reports that full sensation has come back to his hand.  ROM is also normal, He is doing fine and will see [place] for SR.

## 2014-08-21 ENCOUNTER — Ambulatory Visit (INDEPENDENT_AMBULATORY_CARE_PROVIDER_SITE_OTHER): Payer: Worker's Compensation | Admitting: Family Medicine

## 2014-08-21 VITALS — BP 122/71 | HR 58 | Temp 98.5°F | Resp 16

## 2014-08-21 DIAGNOSIS — S51002D Unspecified open wound of left elbow, subsequent encounter: Secondary | ICD-10-CM | POA: Diagnosis not present

## 2014-08-21 NOTE — Progress Notes (Signed)
  Subjective:  Patient ID: Steven [name], male    DOB: 1960-08-11  Age: 54 y.o. MRN: [id number]  Patient is here for a follow-up with regard to a wound on his right elbow. It is been 9 days. It is doing well.   Objective:   Well-healed wound right elbow. Sutures removed without difficulty. Steri-Strips applied.  Assessment & Plan:   Assessment: Wound elbow, improved  Plan: There are no Patient Instructions on file for this visit.   Pegeen Stiger, MD 08/21/2014

## 2014-08-21 NOTE — Patient Instructions (Signed)
Try and keep the area clean. Keep the Steri-Strips or Band-Aids on the elbow to protect it for about 5 more days. Return if problems or concerns.

## 2015-03-09 ENCOUNTER — Encounter (HOSPITAL_COMMUNITY): Payer: Self-pay

## 2015-03-09 ENCOUNTER — Emergency Department (HOSPITAL_COMMUNITY)
Admission: EM | Admit: 2015-03-09 | Discharge: 2015-03-09 | Disposition: A | Discharge status: Home / Self Care | Payer: Commercial Managed Care - HMO | Attending: Emergency Medicine | Admitting: Emergency Medicine

## 2015-03-09 ENCOUNTER — Emergency Department (HOSPITAL_COMMUNITY): Payer: Commercial Managed Care - HMO

## 2015-03-09 DIAGNOSIS — Z856 Personal history of leukemia: Secondary | ICD-10-CM | POA: Insufficient documentation

## 2015-03-09 DIAGNOSIS — D72829 Elevated white blood cell count, unspecified: Secondary | ICD-10-CM | POA: Diagnosis not present

## 2015-03-09 DIAGNOSIS — I10 Essential (primary) hypertension: Secondary | ICD-10-CM | POA: Diagnosis not present

## 2015-03-09 DIAGNOSIS — Z87891 Personal history of nicotine dependence: Secondary | ICD-10-CM | POA: Insufficient documentation

## 2015-03-09 DIAGNOSIS — Z8701 Personal history of pneumonia (recurrent): Secondary | ICD-10-CM | POA: Insufficient documentation

## 2015-03-09 DIAGNOSIS — Z7982 Long term (current) use of aspirin: Secondary | ICD-10-CM | POA: Insufficient documentation

## 2015-03-09 DIAGNOSIS — R112 Nausea with vomiting, unspecified: Secondary | ICD-10-CM

## 2015-03-09 DIAGNOSIS — Z79899 Other long term (current) drug therapy: Secondary | ICD-10-CM | POA: Diagnosis not present

## 2015-03-09 DIAGNOSIS — K529 Noninfective gastroenteritis and colitis, unspecified: Secondary | ICD-10-CM

## 2015-03-09 DIAGNOSIS — R197 Diarrhea, unspecified: Secondary | ICD-10-CM

## 2015-03-09 DIAGNOSIS — R109 Unspecified abdominal pain: Secondary | ICD-10-CM | POA: Diagnosis present

## 2015-03-09 DIAGNOSIS — E785 Hyperlipidemia, unspecified: Secondary | ICD-10-CM | POA: Diagnosis not present

## 2015-03-09 LAB — COMPREHENSIVE METABOLIC PANEL
ALK PHOS: 72 U/L (ref 38–126)
ALT: 35 U/L (ref 17–63)
AST: 30 U/L (ref 15–41)
Albumin: 4.3 g/dL (ref 3.5–5.0)
Anion gap: 12 (ref 5–15)
BUN: 20 mg/dL (ref 6–20)
CALCIUM: 9.6 mg/dL (ref 8.9–10.3)
CO2: 20 mmol/L — AB (ref 22–32)
CREATININE: 1.5 mg/dL — AB (ref 0.61–1.24)
Chloride: 107 mmol/L (ref 101–111)
GFR calc non Af Amer: 50 mL/min — ABNORMAL LOW (ref >60)
GFR, EST AFRICAN AMERICAN: 58 mL/min — AB (ref >60)
GLUCOSE: 148 mg/dL — AB (ref 65–99)
Potassium: 4.2 mmol/L (ref 3.5–5.1)
SODIUM: 139 mmol/L (ref 135–145)
Total Bilirubin: 0.9 mg/dL (ref 0.3–1.2)
Total Protein: 7.4 g/dL (ref 6.5–8.1)

## 2015-03-09 LAB — C DIFFICILE QUICK SCREEN W PCR REFLEX
C DIFFICILE (CDIFF) INTERP: NEGATIVE
C Diff antigen: NEGATIVE
C Diff toxin: NEGATIVE

## 2015-03-09 LAB — I-STAT TROPONIN, ED
TROPONIN I, POC: 0 ng/mL (ref 0.00–0.08)
Troponin i, poc: 0.01 ng/mL (ref 0.00–0.08)

## 2015-03-09 LAB — CBC
HCT: 53.5 % — ABNORMAL HIGH (ref 39.0–52.0)
Hemoglobin: 19 g/dL — ABNORMAL HIGH (ref 13.0–17.0)
MCH: 30.6 pg (ref 26.0–34.0)
MCHC: 35.5 g/dL (ref 30.0–36.0)
MCV: 86.3 fL (ref 78.0–100.0)
PLATELETS: 224 10*3/uL (ref 150–400)
RBC: 6.2 MIL/uL — AB (ref 4.22–5.81)
RDW: 15.3 % (ref 11.5–15.5)
WBC: 20.2 10*3/uL — ABNORMAL HIGH (ref 4.0–10.5)

## 2015-03-09 LAB — LIPASE, BLOOD: Lipase: 21 U/L (ref 11–51)

## 2015-03-09 LAB — POC OCCULT BLOOD, ED: Fecal Occult Bld: POSITIVE — AB

## 2015-03-09 MED ORDER — IOHEXOL 300 MG/ML  SOLN
100.0000 mL | Freq: Once | INTRAMUSCULAR | Status: AC | PRN
  Administered 2015-03-09: 100 mL via INTRAVENOUS

## 2015-03-09 MED ORDER — MORPHINE SULFATE (PF) 4 MG/ML IV SOLN
6.0000 mg | Freq: Once | INTRAVENOUS | Status: AC
  Administered 2015-03-09: 6 mg via INTRAVENOUS
  Filled 2015-03-09: qty 2

## 2015-03-09 MED ORDER — ONDANSETRON HCL 4 MG PO TABS: 4.0000 mg | ORAL_TABLET | Freq: Three times a day (TID) | ORAL | Status: DC | PRN

## 2015-03-09 MED ORDER — SODIUM CHLORIDE 0.9 % IV SOLN
1000.0000 mL | Freq: Once | INTRAVENOUS | Status: AC
  Administered 2015-03-09: 1000 mL via INTRAVENOUS

## 2015-03-09 MED ORDER — ONDANSETRON HCL 4 MG/2ML IJ SOLN
4.0000 mg | Freq: Once | INTRAMUSCULAR | Status: AC | PRN
  Administered 2015-03-09: 4 mg via INTRAVENOUS
  Filled 2015-03-09: qty 2

## 2015-03-09 MED ORDER — SODIUM CHLORIDE 0.9 % IV SOLN
1000.0000 mL | INTRAVENOUS | Status: DC
  Administered 2015-03-09: 1000 mL via INTRAVENOUS

## 2015-03-09 NOTE — Discharge Instructions (Signed)

## 2015-03-09 NOTE — ED Provider Notes (Signed)
CSN: [id number]     Arrival date & time 03/09/15  [id number] History   First MD Initiated Contact with Patient 03/09/15 [phone]     Chief Complaint  Patient presents with  . Emesis  . Nausea  . Abdominal Pain  . Chest Pain     (Consider location/radiation/quality/duration/timing/severity/associated sxs/prior Treatment) HPI   54 year old male with history of CML, hypertension, hyperlipidemia presenting with complaints of abdominal pain. Patient reports 3 days ago during Christmas he was doing fine and was able to enjoy Christmas with his family. The next day he developed persistent nonbloody non-mucousy diarrhea less than 10 bouts of days and throughout today he has been feeling nauseous, vomiting multiple times as well as having diarrhea. He is unable to tolerate any food as it worsen his symptoms. He also endorsed diffuse crampy abdominal pain relief with having bowel movement. Pain is waxing waning and comes in waves. He endorsed chills, feeling nauseous. He has tried drinking ginger ale only without adequate relief. Denies any fever, chest pain, shortness breath, lightheadedness or dizziness, back pain, dysuria, or rash. He had colonoscopy last year and it was normal. He denies any recent antibiotic use. He reported his CML is currently in remission. He denies any recent sick contact or recent travel. No prior history of diverticulosis and diverticulitis.  Past Medical History  Diagnosis Date  . Hypertension     a. Dx 2006  . CML (chronic myeloid leukemia) ([place])     a. Dx 2011 - followed @ [facility] - remission  . Hyperlipidemia     a. Dx 2006  . Chest pain     a. Cath 2007:  luminal irregs, normal LVF (Dr. Doylene Canard) - was placed on plavix afterwards.;   b.  ETT-MV 11/13:   mild inferior ischemia, EF 59%.  . Pneumonia     hx of PNA  . Hepatitis     IN THE 1980'S   Past Surgical History  Procedure Laterality Date  . Left rotator cuff repair      a. approx. 2003  . Cardiac  catheterization  02/16/2012    30% tubular prox RCA stenosis, normal coronaries otherwise; LVEF 65%, no WMAs  . Left heart catheterization with coronary angiogram N/A 02/16/2012    Procedure: LEFT HEART CATHETERIZATION WITH CORONARY ANGIOGRAM;  Surgeon: Josue Hector, MD;  Location: [facility] CATH LAB;  Service: Cardiovascular;  Laterality: N/A;   Family History  Problem Relation Age of Onset  . Heart attack Father     Alive @ 17 (stenting @ 52)  . Other Mother     Alive & well @ 38  . Other Brother     Alive @ 73   Social History  Substance Use Topics  . Smoking status: Former Smoker -- 1.00 packs/day for 30 years    Quit date: 02/14/2005  . Smokeless tobacco: Never Used     Comment: smoked 1-1/12 ppd for better part of 30 years  . Alcohol Use: Yes     Comment: Rare drink    Review of Systems  All other systems reviewed and are negative.     Allergies  Review of patient's allergies indicates no known allergies.  Home Medications   Prior to Admission medications   Medication Sig Start Date End Date Taking? Authorizing Provider  aspirin 81 MG chewable tablet Chew 1 tablet (81 mg total) by mouth daily. 02/16/12   Roger A Arguello, PA-C  atenolol (TENORMIN) 25 MG tablet Take 25 mg by  mouth daily.    Historical Provider, MD  dasatinib (SPRYCEL) 100 MG tablet Take 100 mg by mouth daily.    Historical Provider, MD  hydrochlorothiazide (HYDRODIURIL) 25 MG tablet Take 1 tablet (25 mg total) by mouth daily. 06/11/12   Jolaine Artist, MD  potassium chloride (K-DUR) 10 MEQ tablet Take 2 tablets (20 mEq total) by mouth daily. 05/10/12   Liliane Shi, PA-C  pravastatin (PRAVACHOL) 20 MG tablet Take 20 mg by mouth every evening.    Historical Provider, MD   BP 156/95 mmHg  Pulse 94  Temp(Src) 98 F (36.7 C) (Oral)  Resp 20  Ht 5\' 11"  (1.803 m)  Wt 95.255 kg  BMI 29.30 kg/m2  SpO2 96% Physical Exam  Constitutional: He appears well-developed and well-nourished. No distress.   Caucasian male, appears uncomfortable, dry heaving.  HENT:  Head: Atraumatic.  Eyes: Conjunctivae are normal.  Neck: Neck supple.  Cardiovascular: Normal rate and regular rhythm.   Pulmonary/Chest: Effort normal and breath sounds normal.  Abdominal: There is tenderness (Diffuse abdominal tenderness on palpation with guarding but without rebound tenderness. No focal point tenderness.).  Neurological: He is alert.  Skin: No rash noted.  Psychiatric: He has a normal mood and affect.  Nursing note and vitals reviewed.   ED Course  Procedures (including critical care time) Labs Review Labs Reviewed  COMPREHENSIVE METABOLIC PANEL - Abnormal; Notable for the following:    CO2 20 (*)    Glucose, Bld 148 (*)    Creatinine, Ser 1.50 (*)    GFR calc non Af Amer 50 (*)    GFR calc Af Amer 58 (*)    All other components within normal limits  CBC - Abnormal; Notable for the following:    WBC 20.2 (*)    RBC 6.20 (*)    Hemoglobin 19.0 (*)    HCT 53.5 (*)    All other components within normal limits  POC OCCULT BLOOD, ED - Abnormal; Notable for the following:    Fecal Occult Bld POSITIVE (*)    All other components within normal limits  C DIFFICILE QUICK SCREEN W PCR REFLEX  LIPASE, BLOOD  I-STAT TROPOININ, ED  I-STAT TROPOININ, ED    Imaging Review Dg Chest 2 View  03/09/2015  CLINICAL DATA:  Acute onset of vomiting, diarrhea and shortness of breath. Initial encounter. EXAM: CHEST  2 VIEW COMPARISON:  Chest radiograph performed 01/30/2012 FINDINGS: The lungs are well-aerated. Mild peribronchial thickening is noted, with chronically increased interstitial markings. There is no evidence of pleural effusion or pneumothorax. The heart is normal in size; the mediastinal contour is within normal limits. No acute osseous abnormalities are seen. IMPRESSION: Mild peribronchial thickening, with chronically increased interstitial markings. Electronically Signed   By: Garald Balding M.D.   On:  03/09/2015 07:05   Ct Abdomen Pelvis W Contrast  03/09/2015  CLINICAL DATA:  54 year old male with diffuse abdominal pain, nausea, vomiting and diarrhea EXAM: CT ABDOMEN AND PELVIS WITH CONTRAST TECHNIQUE: Multidetector CT imaging of the abdomen and pelvis was performed using the standard protocol following bolus administration of intravenous contrast. CONTRAST:  158mL OMNIPAQUE IOHEXOL 300 MG/ML  SOLN COMPARISON:  Prior CT abdomen/pelvis 06/16/2014 FINDINGS: Lower Chest: The lung bases are clear. Visualized cardiac structures are within normal limits for size. No pericardial effusion. Unremarkable visualized distal thoracic esophagus. Abdomen: Unremarkable CT appearance of the stomach, duodenum, spleen, adrenal glands and pancreas. Normal hepatic contour and morphology. 5 mm circumscribed hypo attenuating focus in the  posterior aspect of hepatic segment 2 is unchanged compared to prior imaging and almost certainly a small cyst. No other discrete lesion identified. Gallbladder is unremarkable. No intra or extrahepatic biliary ductal dilatation. Unremarkable appearance of the bilateral kidneys. No focal solid lesion, hydronephrosis or nephrolithiasis. New mild interstitial stranding within the root of the mesentery with associated mildly prominent lymph nodes which remain unenlarged by CT criteria. The appearance of the mesenteries consistent with 'misty mesentery'. However, the size of the nodes in the stranding represents an interval finding compared to 06/16/2014. No evidence of bowel obstruction. Perhaps slight hyper enhancement and thickening of the small bowel mucosa when compared to prior imaging. Normal appendix. No free fluid. Pelvis: Unremarkable bladder, prostate gland and seminal vesicles. No free fluid or suspicious adenopathy. Bones/Soft Tissues: No acute fracture or aggressive appearing lytic or blastic osseous lesion. Vascular: No aneurysm. Minimal atherosclerotic vascular calcification.  IMPRESSION: 1. Perhaps mild hyper enhancement and edema of the small bowel mucosa. When combined with new mesenteric interstitial stranding and borderline mesenteric adenopathy compared to 06/16/2014 findings are most consistent with an acute infectious or inflammatory process involving the small bowel such as gastroenteritis. No evidence of colonic involvement. Less likely considerations for 'misty mesentery' include mesenteric sclerosis, and lymphoma. Given the patient's acute symptoms these are considered significantly less likely. However, consider follow-up CT scan of the abdomen and pelvis with contrast in 3-6 months to confirm stability and exclude progressive adenopathy. Electronically Signed   By: Jacqulynn Cadet M.D.   On: 03/09/2015 08:30   I have personally reviewed and evaluated these images and lab results as part of my medical decision-making.   EKG Interpretation None      MDM   Final diagnoses:  Nausea vomiting and diarrhea  Gastroenteritis  Leukocytosis    BP 115/90 mmHg  Pulse 94  Temp(Src) 98 F (36.7 C) (Oral)  Resp 15  Ht 5\' 11"  (1.803 m)  Wt 95.255 kg  BMI 29.30 kg/m2  SpO2 94%   7:22 AM Patient presents with nausea vomiting diarrhea and abdominal pain. This is likely to be viral GI however, given the severity of his abdominal pain and his age, an abdominal and pelvis CT scan will be obtained to rule out acute pathology. Pain medication given, IV fluid given. Will monitor closely.  11:12 AM  Patient has leukocytosis with WBC 20.2. He is dehydrated with hemoconcentrated blood as evidenced by a hemoglobin of 19.0. Mild renal insufficiency with creatinine of 1.5. Positive fecal occult blood. C. difficile test is negative. His chest x-ray demonstrate mild peribronchial thickening, with chronically increased interstitial markings. No evidence of pneumonia. Abdominal and pelvic CT scan showing Mrs. Terry interstitial stranding with borderline mesenteric adenopathy  and mild hyperenhancement and edema of the small bowel mucosa. These findings are most consistence with acute infectious or inflammatory process involving the small bowel such as gastroenteritis.  The finding of CT scan corresponds with patient's presenting complaint. Patient received IV fluid in the ED and felt better. He is able to tolerates by mouth and request to be discharged. He will follow-up with his oncologist in 6 months and will need to have a repeat abdominal CT scan to rule out malignancy. Strict return precautions discussed.   Domenic Moras, PA-C 03/09/15 [place], MD 03/10/15 [phone]

## 2015-03-09 NOTE — ED Notes (Signed)
Pt presents with n/v/d that started yesterday around noon that has continued to this morning. Pt complains of abd cramps. Pt also complains of chest/epigastric tightness and sob. Pt reports 15 vomiting episodes and in the last 24 hours and having diarrhea every time he drinks something.

## 2015-03-09 NOTE — ED Notes (Signed)
Pt assisted over to bedside commode, still dry heaving, pt states "I just want to sit on the bedside commode"

## 2015-03-09 NOTE — ED Notes (Signed)
Patient placed on C-diff precautions.

## 2015-03-09 NOTE — ED Notes (Signed)
Patient has had 3 BM since this RN on shift PA made aware BM loose and bright red.

## 2015-03-09 NOTE — ED Notes (Signed)
Pt back from x-ray and very nauseous. Pt also shaking and complaining now of 10/10 abd pain. [place]ie PA in room with pt.

## 2015-08-09 DIAGNOSIS — C9211 Chronic myeloid leukemia, BCR/ABL-positive, in remission: Secondary | ICD-10-CM | POA: Diagnosis not present

## 2016-02-11 DIAGNOSIS — C9210 Chronic myeloid leukemia, BCR/ABL-positive, not having achieved remission: Secondary | ICD-10-CM

## 2016-02-25 DIAGNOSIS — L270 Generalized skin eruption due to drugs and medicaments taken internally: Secondary | ICD-10-CM | POA: Diagnosis not present

## 2016-02-25 DIAGNOSIS — C9210 Chronic myeloid leukemia, BCR/ABL-positive, not having achieved remission: Secondary | ICD-10-CM | POA: Diagnosis not present

## 2016-04-27 DIAGNOSIS — C9211 Chronic myeloid leukemia, BCR/ABL-positive, in remission: Secondary | ICD-10-CM | POA: Diagnosis not present

## 2016-05-31 ENCOUNTER — Ambulatory Visit (INDEPENDENT_AMBULATORY_CARE_PROVIDER_SITE_OTHER): Payer: Worker's Compensation | Admitting: Family Medicine

## 2016-05-31 ENCOUNTER — Ambulatory Visit (INDEPENDENT_AMBULATORY_CARE_PROVIDER_SITE_OTHER): Payer: Self-pay

## 2016-05-31 VITALS — BP 124/80 | HR 72 | Temp 97.4°F | Resp 16 | Ht 71.0 in | Wt 222.0 lb

## 2016-05-31 DIAGNOSIS — M79672 Pain in left foot: Secondary | ICD-10-CM

## 2016-05-31 MED ORDER — TRAMADOL HCL 50 MG PO TABS: 50.0000 mg | ORAL_TABLET | Freq: Three times a day (TID) | ORAL | 0 refills | Status: DC | PRN

## 2016-05-31 NOTE — Patient Instructions (Addendum)
  It was very good to meet you today.  You do not have any evidence of fracture on x-ray.  Keep doing what you have been doing: Elevate her foot went home, put ice on it 20 minutes on 20 minutes off. Take the tramadol for pain relief for the next couple days when he need it.  If you not noticing improvement over the next week come back and see [place]. If the pain begins to get worse in the next several days do not wait and come back immediately.     IF you received an x-ray today, you will receive an invoice from [facility]. Please contact [facility] at [phone] with questions or concerns regarding your invoice.   IF you received labwork today, you will receive an invoice from [facility]. Please contact [facility] at [phone] with questions or concerns regarding your invoice.   Our billing staff will not be able to assist you with questions regarding bills from these companies.  You will be contacted with the lab results as soon as they are available. The fastest way to get your results is to activate your My Chart account. Instructions are located on the last page of this paperwork. If you have not heard from [place] regarding the results in 2 weeks, please contact this office.

## 2016-05-31 NOTE — Progress Notes (Addendum)
   Steven [name] [birth year] 56 y.o.   Chief Complaint  Patient presents with  . 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  Date of Injury: 05/30/16  History of Present Illness:  Left foot injury:  Patient was at work yesterday when he He was attempting to lift a 250-300 pound steel cube structure. He was attempting to lift this with another worker. The other worker attempted to shift it and the steel door fell open onto his left foot. It struck the dorsal part of his foot. He was wearing steel toed boots but of course these do not provide protection to the dorsum of the foot. He had immediate pain. He felt nauseous for the first 5 minutes after due to the degree of pain. As the day continued he finished his shift at work. He states he had to walk on the heel of his foot because standing or attempting to walk regularly caused too much pain on the dorsum and all his foot. He took Tylenol last night, 2 extra strength tabs, and had some relief. He also used ice and kept his foot elevated. However the pain continued to keep him awake overnight. This morning he attempted to go back to work but did to the degree of pain he came in to be evaluated.   ROS MSK:  No foot pain Right foot.   Neuro:  No paresthesias, numbness or tingling in his toes.   CV:  No paleness in foot or toes Skin:  Right foot with bruising last night   Current medications and allergies reviewed and updated. Past medical history, family history, social history have been reviewed and updated.   Physical Exam Gen:  Alert, cooperative patient who appears stated age in no acute distress.  Vital signs reviewed. Head: [place]/AT.   Eyes:  EOMI, PERRL.   Ears:  Hearing WNL Nose:  Septum midline  Mouth:  MMM, tonsils non-erythematous, non-edematous.   CV:  Heart RRR Lungs:  Clear Abd: Obese/benign Ext:  No LE edema MSK:   - RIght foot WNL - Left foot:  Ambulates with  antalgic gait.  Extensive swelling noted along entire dorsum of foot. Extends from MTP joint to mid foot. Tender palpation directly along dorsum of foot first second third and fourth metatarsals. No tenderness along fifth metatarsal. No tenderness head of the fifth metatarsal.  Skin:  Extensive bruising along entire dorsum of Left foot. Neurovascular:  Unable to palpate pulses secondary to swelling and pain but he has good cap refill in all 5 toes. Sensation is intact throughout the entire foot including all 5 toes.  Assessment and Plan: Left foot tarsal contustion: - Negative xray here for fracture - Plan to treat conservatively with ice, rest.  He declined any compression as he states he's not able to get his boots or shoes over his foot if he uses a brace or wrap.

## 2016-06-13 IMAGING — DX DG CHEST 2V
2 series · 2 of 2 positions shown · non-contrast
Comparison: Chest radiograph performed 01/30/2012

CLINICAL DATA: Acute onset of vomiting, diarrhea and shortness of
breath. Initial encounter.

EXAM:
CHEST  2 VIEW

[chest pa]
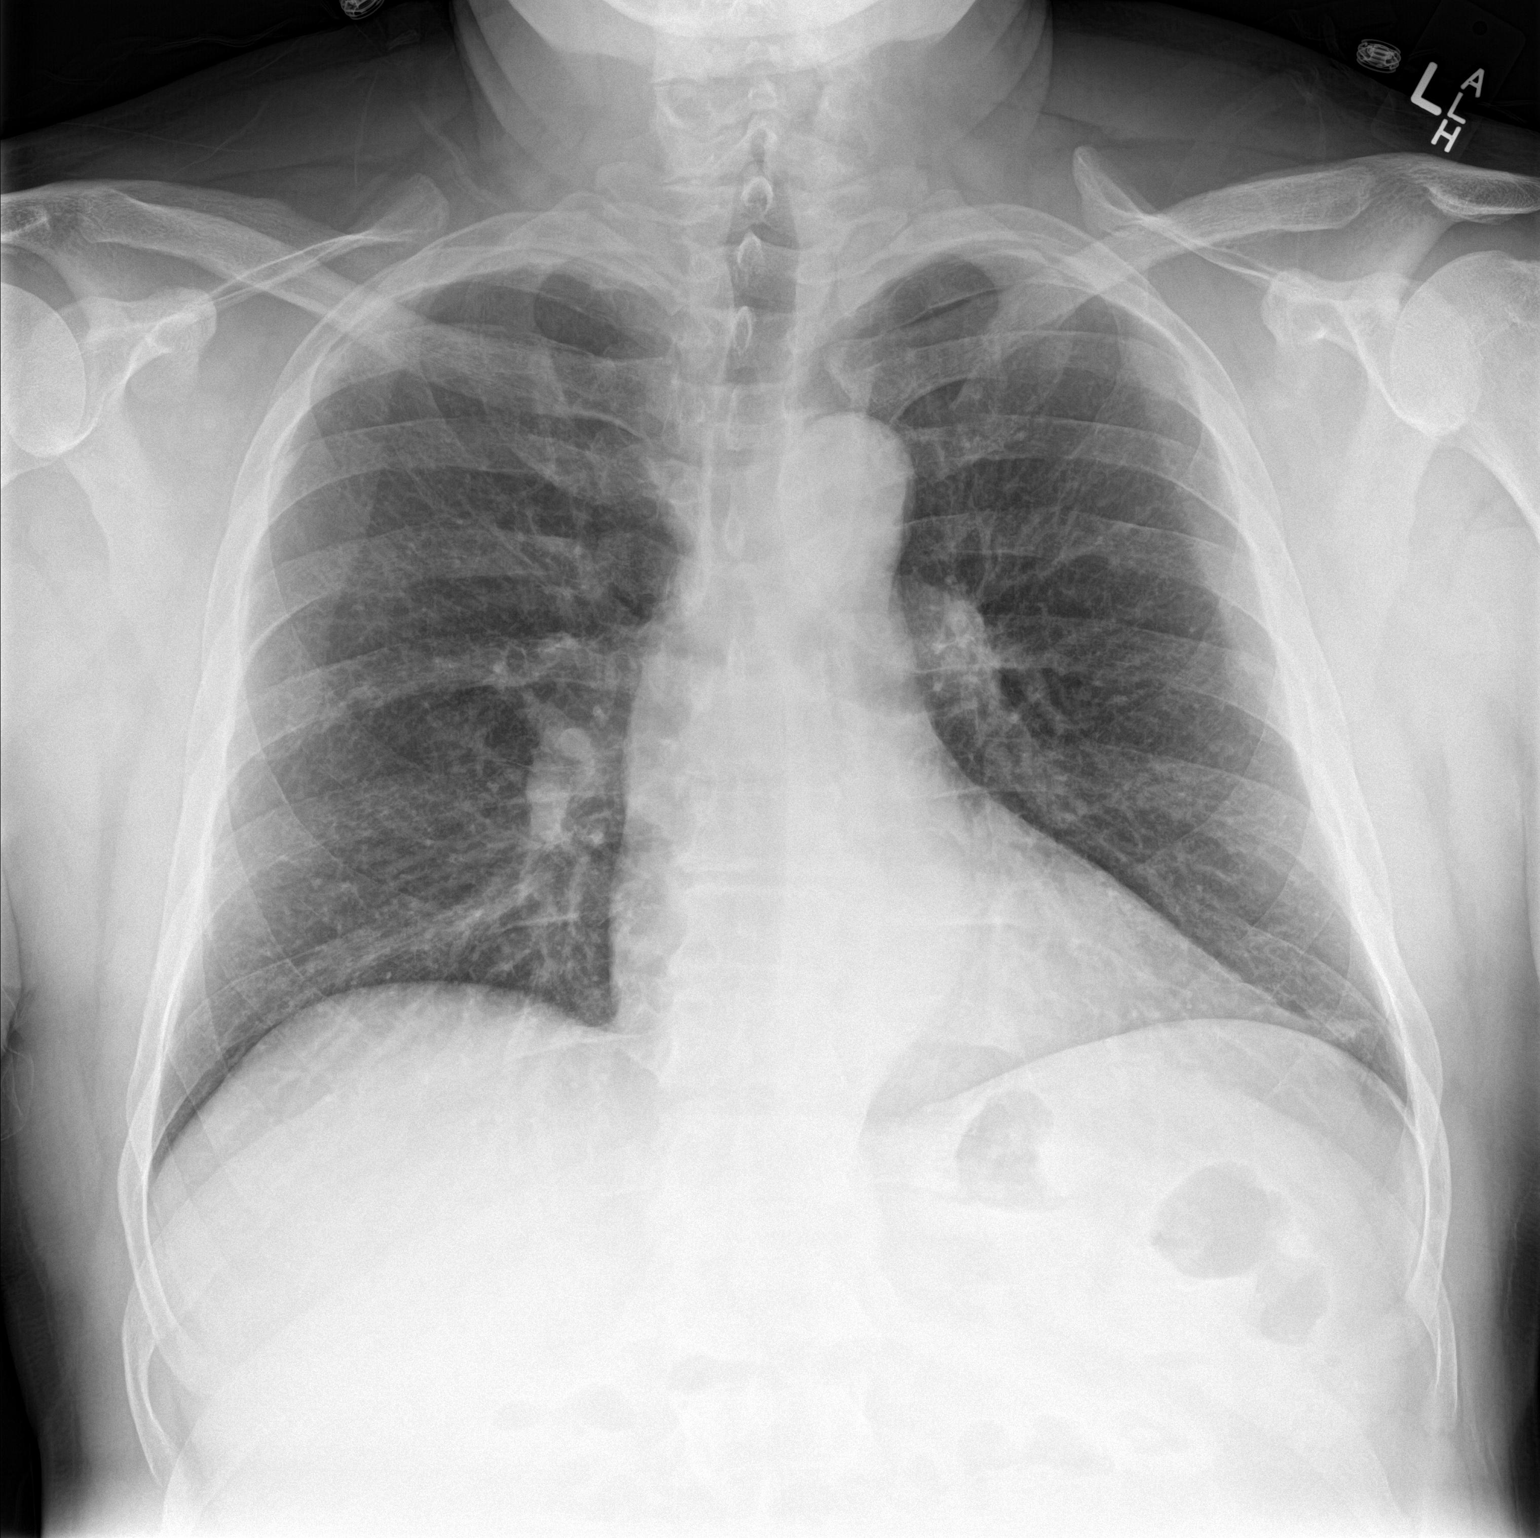

[chest lat]
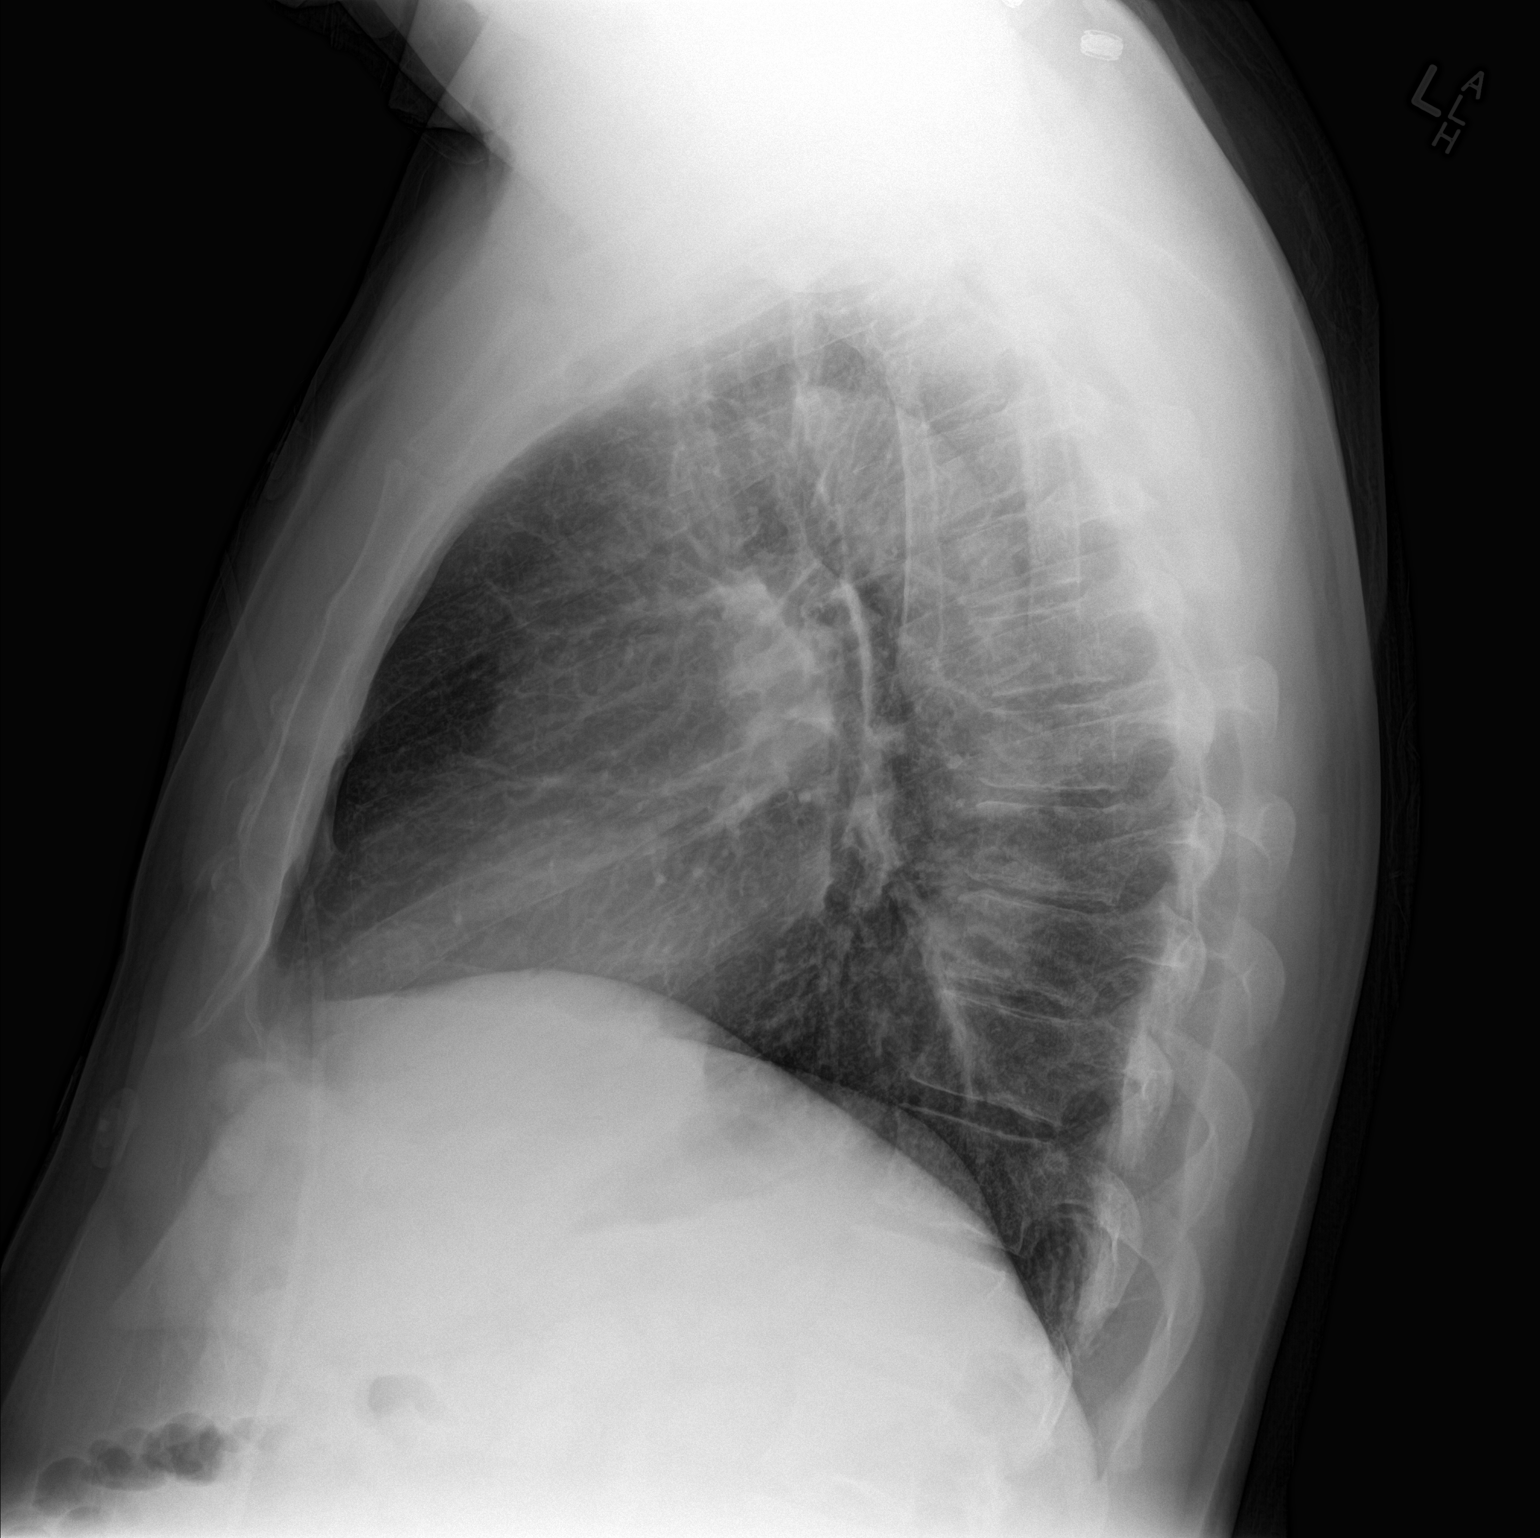

[2 of 2 positions shown; findings below may reference images not displayed]

FINDINGS: The lungs are well-aerated. Mild peribronchial thickening is noted,
with chronically increased interstitial markings. There is no
evidence of pleural effusion or pneumothorax.

The heart is normal in size; the mediastinal contour is within
normal limits. No acute osseous abnormalities are seen.
IMPRESSION: Mild peribronchial thickening, with chronically increased
interstitial markings.

## 2016-08-25 DIAGNOSIS — C9211 Chronic myeloid leukemia, BCR/ABL-positive, in remission: Secondary | ICD-10-CM | POA: Diagnosis not present

## 2017-02-26 DIAGNOSIS — C9211 Chronic myeloid leukemia, BCR/ABL-positive, in remission: Secondary | ICD-10-CM

## 2017-06-26 ENCOUNTER — Encounter (HOSPITAL_COMMUNITY): Payer: Self-pay | Admitting: Emergency Medicine

## 2017-06-26 ENCOUNTER — Emergency Department (HOSPITAL_COMMUNITY)
Admission: EM | Admit: 2017-06-26 | Discharge: 2017-06-26 | Disposition: A | Discharge status: Home / Self Care | Payer: BLUE CROSS/BLUE SHIELD | Attending: Emergency Medicine | Admitting: Emergency Medicine

## 2017-06-26 ENCOUNTER — Emergency Department (HOSPITAL_COMMUNITY): Payer: BLUE CROSS/BLUE SHIELD

## 2017-06-26 DIAGNOSIS — Z7982 Long term (current) use of aspirin: Secondary | ICD-10-CM | POA: Insufficient documentation

## 2017-06-26 DIAGNOSIS — I10 Essential (primary) hypertension: Secondary | ICD-10-CM | POA: Diagnosis not present

## 2017-06-26 DIAGNOSIS — R1031 Right lower quadrant pain: Secondary | ICD-10-CM | POA: Insufficient documentation

## 2017-06-26 DIAGNOSIS — Z87891 Personal history of nicotine dependence: Secondary | ICD-10-CM | POA: Insufficient documentation

## 2017-06-26 DIAGNOSIS — C9211 Chronic myeloid leukemia, BCR/ABL-positive, in remission: Secondary | ICD-10-CM | POA: Diagnosis not present

## 2017-06-26 DIAGNOSIS — I2510 Atherosclerotic heart disease of native coronary artery without angina pectoris: Secondary | ICD-10-CM | POA: Diagnosis not present

## 2017-06-26 DIAGNOSIS — Z79899 Other long term (current) drug therapy: Secondary | ICD-10-CM | POA: Diagnosis not present

## 2017-06-26 LAB — LIPASE, BLOOD: Lipase: 27 U/L (ref 11–51)

## 2017-06-26 LAB — URINALYSIS, ROUTINE W REFLEX MICROSCOPIC
BILIRUBIN URINE: NEGATIVE
Glucose, UA: NEGATIVE mg/dL
Hgb urine dipstick: NEGATIVE
Ketones, ur: NEGATIVE mg/dL
Leukocytes, UA: NEGATIVE
NITRITE: NEGATIVE
PROTEIN: NEGATIVE mg/dL
Specific Gravity, Urine: 1.011 (ref 1.005–1.030)
pH: 6 (ref 5.0–8.0)

## 2017-06-26 LAB — COMPREHENSIVE METABOLIC PANEL
ALT: 34 U/L (ref 17–63)
ANION GAP: 10 (ref 5–15)
AST: 35 U/L (ref 15–41)
Albumin: 4.3 g/dL (ref 3.5–5.0)
Alkaline Phosphatase: 66 U/L (ref 38–126)
BILIRUBIN TOTAL: 0.8 mg/dL (ref 0.3–1.2)
BUN: 12 mg/dL (ref 6–20)
CALCIUM: 9.4 mg/dL (ref 8.9–10.3)
CO2: 21 mmol/L — ABNORMAL LOW (ref 22–32)
CREATININE: 1.1 mg/dL (ref 0.61–1.24)
Chloride: 105 mmol/L (ref 101–111)
GFR calc non Af Amer: >60 mL/min (ref >60)
Glucose, Bld: 92 mg/dL (ref 65–99)
Potassium: 3.5 mmol/L (ref 3.5–5.1)
Sodium: 136 mmol/L (ref 135–145)
TOTAL PROTEIN: 7.1 g/dL (ref 6.5–8.1)

## 2017-06-26 LAB — CBC
HCT: 43.3 % (ref 39.0–52.0)
HEMOGLOBIN: 15.2 g/dL (ref 13.0–17.0)
MCH: 30.2 pg (ref 26.0–34.0)
MCHC: 35.1 g/dL (ref 30.0–36.0)
MCV: 86.1 fL (ref 78.0–100.0)
PLATELETS: 221 10*3/uL (ref 150–400)
RBC: 5.03 MIL/uL (ref 4.22–5.81)
RDW: 14.5 % (ref 11.5–15.5)
WBC: 7.1 10*3/uL (ref 4.0–10.5)

## 2017-06-26 MED ORDER — IOPAMIDOL (ISOVUE-300) INJECTION 61%
100.0000 mL | Freq: Once | INTRAVENOUS | Status: AC
  Administered 2017-06-26: 100 mL via INTRAVENOUS

## 2017-06-26 MED ORDER — IOPAMIDOL (ISOVUE-300) INJECTION 61%
INTRAVENOUS | Status: AC
  Filled 2017-06-26: qty 100

## 2017-06-26 MED ORDER — ONDANSETRON HCL 4 MG PO TABS: 4.0000 mg | ORAL_TABLET | Freq: Four times a day (QID) | ORAL | 0 refills | Status: DC

## 2017-06-26 NOTE — ED Triage Notes (Signed)
Pt to ER for evaluation of RLQ abdominal pain x3 days with n/v, without diarrhea. States sharp in nature. Reports no appetite.

## 2017-06-26 NOTE — ED Provider Notes (Signed)
[facility] EMERGENCY DEPARTMENT Provider Note   CSN: [id number] Arrival date & time: 06/26/17  4782   History   Chief Complaint Chief Complaint  Patient presents with  . Abdominal Pain    HPI Steven [name] is a 57 y.o. male.  HPI   57 year old male presents today with complaints of abdominal pain.  Patient reports pain in his right lower quadrant over the last 3 days, he notes it has eased off after having a bowel movement.  Patient denies any changes in bowel movements, urinary habits or frequency.  Patient reports 2 days ago he did have episodes of nausea and vomiting, none since.  Reports that sharp in nature, he notes a decreased appetite.  Patient denies any testicular or penile pain.   Past Medical History:  Diagnosis Date  . Chest pain    a. Cath 2007:  luminal irregs, normal LVF (Dr. Doylene Canard) - was placed on plavix afterwards.;   b.  ETT-MV 11/13:   mild inferior ischemia, EF 59%.  . 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 Dx 2011 - followed @ [facility] - remission  . Hepatitis    IN THE 1980'S  . Hyperlipidemia    a. Dx 2006  . Hypertension    a. Dx 2006  . Pneumonia    hx of PNA    Patient Active Problem List   Diagnosis Date Noted  . Hyperlipidemia 02/16/2012  . Essential hypertension 02/16/2012  . CML (chronic myelocytic leukemia) ([place]) 02/16/2012  . CAD (coronary artery disease), native coronary artery 02/16/2012  . Substernal chest pain 01/30/2012    Past Surgical History:  Procedure Laterality Date  . CARDIAC CATHETERIZATION  02/16/2012   30% tubular prox RCA stenosis, normal coronaries otherwise; LVEF 65%, no WMAs  . LEFT HEART CATHETERIZATION WITH CORONARY ANGIOGRAM N/A 02/16/2012   Procedure: LEFT HEART CATHETERIZATION WITH CORONARY ANGIOGRAM;  Surgeon: Josue Hector, MD;  Location: [facility] CATH LAB;  Service: Cardiovascular;  Laterality: N/A;  . Left Rotator Cuff Repair     a. approx. 2003        Home  Medications    Prior to Admission medications   Medication Sig Start Date End Date Taking? Authorizing Provider  aspirin 81 MG chewable tablet Chew 1 tablet (81 mg total) by mouth daily. 02/16/12   Arguello, Roger A, PA-C  atenolol (TENORMIN) 25 MG tablet Take 25 mg by mouth daily.    [provider]  dasatinib (SPRYCEL) 100 MG tablet Take 100 mg by mouth daily.    [provider]  hydrochlorothiazide (HYDRODIURIL) 25 MG tablet Take 1 tablet (25 mg total) by mouth daily. 06/11/12   Bensimhon, Shaune Pascal, MD  ondansetron (ZOFRAN) 4 MG tablet Take 1 tablet (4 mg total) by mouth every 6 (six) hours. 06/26/17   Benjermin Korber, Dellis Filbert, PA-C  potassium chloride (K-DUR) 10 MEQ tablet Take 2 tablets (20 mEq total) by mouth daily. 05/10/12   Richardson Dopp T, PA-C  pravastatin (PRAVACHOL) 20 MG tablet Take 20 mg by mouth every evening.    [provider]  traMADol (ULTRAM) 50 MG tablet Take 1 tablet (50 mg total) by mouth every 8 (eight) hours as needed. 05/31/16   Alveda Reasons, MD    Family History Family History  Problem Relation Age of Onset  . Heart attack Father        Alive @ 22 (stenting @ 58)  . Other Mother  Alive & well @ 68  . Other Brother        Alive @ 46    Social History Social History   Tobacco Use  . Smoking status: Former Smoker    Packs/day: 1.00    Years: 30.00    Pack years: 30.00    Last attempt to quit: 02/14/2005    Years since quitting: 12.3  . Smokeless tobacco: Never Used  . Tobacco comment: smoked 1-1/12 ppd for better part of 30 years  Substance Use Topics  . Alcohol use: Yes    Comment: Rare drink  . Drug use: No     Allergies   Patient has no known allergies.   Review of Systems Review of Systems  All other systems reviewed and are negative.  Physical Exam Updated Vital Signs BP 128/83 (BP Location: Right Arm)   Pulse 69   Temp 98.6 F (37 C) (Oral)   Resp 18   SpO2 98%   Physical Exam  Constitutional: He is  oriented to person, place, and time. He appears well-developed and well-nourished.  HENT:  Head: Normocephalic and atraumatic.  Eyes: Pupils are equal, round, and reactive to light. Conjunctivae are normal. Right eye exhibits no discharge. Left eye exhibits no discharge. No scleral icterus.  Neck: Normal range of motion. No JVD present. No tracheal deviation present.  Pulmonary/Chest: Effort normal. No stridor.  Abdominal: Soft. He exhibits no distension and no mass. There is tenderness. There is no rebound and no guarding. No hernia.  Tenderness palpation right lower quadrant, no rebound or guarding, no other abdominal tenderness palpation  Neurological: He is alert and oriented to person, place, and time. Coordination normal.  Psychiatric: He has a normal mood and affect. His behavior is normal. Judgment and thought content normal.  Nursing note and vitals reviewed.    ED Treatments / Results  Labs (all labs ordered are listed, but only abnormal results are displayed) Labs Reviewed  COMPREHENSIVE METABOLIC PANEL - Abnormal; Notable for the following components:      Result Value   CO2 21 (*)    All other components within normal limits  LIPASE, BLOOD  CBC  URINALYSIS, ROUTINE W REFLEX MICROSCOPIC    EKG None  Radiology Ct Abdomen Pelvis W Contrast  Result Date: 06/26/2017 CLINICAL DATA:  57 year old male with a history of right lower quadrant pain. History of leukemia. EXAM: CT ABDOMEN AND PELVIS WITH CONTRAST TECHNIQUE: Multidetector CT imaging of the abdomen and pelvis was performed using the standard protocol following bolus administration of intravenous contrast. CONTRAST:  180mL ISOVUE-300 IOPAMIDOL (ISOVUE-300) INJECTION 61% COMPARISON:  Abdominal CT 03/09/2015, 06/16/2014, chest CT 02/27/2017 FINDINGS: Lower chest: No acute abnormality. Hepatobiliary: Unremarkable liver.  Unremarkable gallbladder. Pancreas: Unremarkable appearance of the pancreas Spleen: Unremarkable spleen  Adrenals/Urinary Tract: Unremarkable bilateral adrenal glands. Bilateral kidneys with no hydronephrosis or nephrolithiasis. Unremarkable course the bilateral ureters. Urinary bladder is decompressed with circumferential wall thickening. No inflammatory changes of the adjacent fat. No stones within the urinary bladder lumen. Stomach/Bowel: Unremarkable stomach. Unremarkable small bowel. No transition point. No abnormal dilation. Normal appendix. Mild stool burden. Colonic diverticular disease without evidence of acute inflammatory changes. No transition point. Vascular/Lymphatic: No significant atherosclerotic changes of the abdominal aorta. Bilateral iliac and femoral arteries are patent. Bilateral renal arteries patent. Mesenteric arteries are patent. Reproductive: Transverse diameter of the prostate measures 4.7 cm. Other: Small fat containing umbilical hernia. Small fat containing bilateral inguinal hernia. Musculoskeletal: No acute displaced fracture. Mild degenerative changes of  the visualized thoracolumbar spine. IMPRESSION: No acute CT finding to account for right lower quadrant pain. Circumferential urinary bladder wall thickening without significant inflammatory changes. The finding may reflect chronic bladder outlet obstruction or chronic cystitis. No radiopaque stones within the urinary collecting system or bladder. Diverticular disease without evidence of acute diverticulitis. Electronically Signed   By: Corrie Mckusick D.O.   On: 06/26/2017 15:13    Procedures Procedures (including critical care time)  Medications Ordered in ED Medications  iopamidol (ISOVUE-300) 61 % injection (has no administration in time range)  iopamidol (ISOVUE-300) 61 % injection 100 mL (100 mLs Intravenous Contrast Given 06/26/17 1455)     Initial Impression / Assessment and Plan / ED Course  I have reviewed the triage vital signs and the nursing notes.  Pertinent labs & imaging results that were available during my  care of the patient were reviewed by me and considered in my medical decision making (see chart for details).     Final Clinical Impressions(s) / ED Diagnoses   Final diagnoses:  Right lower quadrant abdominal pain    Labs: UA, Lipase, CMP, CBC  Imaging: CT ABd Pelvis   Consults:  Therapeutics:   Discharge Meds: Zofran  Assessment/Plan:  42 YOM presents with RLQ abd pain.  Patient reports no significant pain at rest at the time of evaluation.  He does have tenderness to the right lower abdomen.  He has no testicular pain, swelling, masses, or any complaints.  Patient CT with no acute findings, laboratory analysis reassuring.  I have low suspicion for appendicitis, diverticulitis or infectious pathology.  Patient does not have any involvement in the testicles that would indicate torsion or any other acute etiology.  Given the fact that patient's symptoms improved after bowel movement, likely GI related.  Patient will be discharged with outpatient follow-up and strict return precautions.  He verbalized understanding and agreement to today's plan had no further questions or concerns at the time of discharge.      ED Discharge Orders        Ordered    ondansetron (ZOFRAN) 4 MG tablet  Every 6 hours     06/26/17 1547       Okey Regal, PA-C 06/26/17 1553    Carmin Muskrat, MD 06/28/17 [phone]

## 2017-06-26 NOTE — Discharge Instructions (Addendum)
Please read attached information. If you experience any new or worsening signs or symptoms please return to the emergency room for evaluation. Please follow-up with your primary care provider or specialist as discussed. Please use medication prescribed only as directed and discontinue taking if you have any concerning signs or symptoms.   °

## 2017-06-26 NOTE — ED Notes (Signed)
Patient reports urinating prior to triage, unable to provide sample at this time.

## 2017-09-18 DIAGNOSIS — C9211 Chronic myeloid leukemia, BCR/ABL-positive, in remission: Secondary | ICD-10-CM

## 2018-03-22 DIAGNOSIS — C9212 Chronic myeloid leukemia, BCR/ABL-positive, in relapse: Secondary | ICD-10-CM

## 2018-04-10 DIAGNOSIS — C9212 Chronic myeloid leukemia, BCR/ABL-positive, in relapse: Secondary | ICD-10-CM | POA: Diagnosis not present

## 2018-04-10 DIAGNOSIS — Z0001 Encounter for general adult medical examination with abnormal findings: Secondary | ICD-10-CM | POA: Diagnosis not present

## 2018-05-10 DIAGNOSIS — C9212 Chronic myeloid leukemia, BCR/ABL-positive, in relapse: Secondary | ICD-10-CM

## 2018-08-08 DIAGNOSIS — C9212 Chronic myeloid leukemia, BCR/ABL-positive, in relapse: Secondary | ICD-10-CM

## 2018-12-09 DIAGNOSIS — D72820 Lymphocytosis (symptomatic): Secondary | ICD-10-CM | POA: Diagnosis not present

## 2018-12-09 DIAGNOSIS — C9211 Chronic myeloid leukemia, BCR/ABL-positive, in remission: Secondary | ICD-10-CM | POA: Diagnosis not present

## 2019-04-10 DIAGNOSIS — C9211 Chronic myeloid leukemia, BCR/ABL-positive, in remission: Secondary | ICD-10-CM

## 2019-08-14 DIAGNOSIS — J343 Hypertrophy of nasal turbinates: Secondary | ICD-10-CM | POA: Diagnosis not present

## 2019-08-14 DIAGNOSIS — R490 Dysphonia: Secondary | ICD-10-CM | POA: Insufficient documentation

## 2019-08-14 DIAGNOSIS — J381 Polyp of vocal cord and larynx: Secondary | ICD-10-CM | POA: Diagnosis not present

## 2019-09-18 DIAGNOSIS — Z01818 Encounter for other preprocedural examination: Secondary | ICD-10-CM | POA: Diagnosis not present

## 2019-09-22 ENCOUNTER — Other Ambulatory Visit: Payer: Self-pay | Admitting: Otolaryngology

## 2019-09-22 DIAGNOSIS — J381 Polyp of vocal cord and larynx: Secondary | ICD-10-CM | POA: Diagnosis not present

## 2019-09-22 DIAGNOSIS — R490 Dysphonia: Secondary | ICD-10-CM | POA: Diagnosis not present

## 2019-10-02 DIAGNOSIS — C9211 Chronic myeloid leukemia, BCR/ABL-positive, in remission: Secondary | ICD-10-CM | POA: Diagnosis not present

## 2019-10-02 DIAGNOSIS — C9210 Chronic myeloid leukemia, BCR/ABL-positive, not having achieved remission: Secondary | ICD-10-CM | POA: Diagnosis not present

## 2019-10-09 DIAGNOSIS — Z79899 Other long term (current) drug therapy: Secondary | ICD-10-CM | POA: Diagnosis not present

## 2019-10-09 DIAGNOSIS — D72820 Lymphocytosis (symptomatic): Secondary | ICD-10-CM | POA: Diagnosis not present

## 2019-10-09 DIAGNOSIS — R319 Hematuria, unspecified: Secondary | ICD-10-CM | POA: Diagnosis not present

## 2019-10-09 DIAGNOSIS — C9210 Chronic myeloid leukemia, BCR/ABL-positive, not having achieved remission: Secondary | ICD-10-CM | POA: Diagnosis not present

## 2020-01-21 ENCOUNTER — Telehealth: Payer: Self-pay

## 2020-01-21 NOTE — Telephone Encounter (Addendum)
Pt has called requesting a new script for Sprycel 50mg  to be sent to [facility]? [place], CMA for Dr Bobby Rumpf, states the new Rx was sent this am. Pt notified @ 1400-awc

## 2020-02-03 DIAGNOSIS — Z79899 Other long term (current) drug therapy: Secondary | ICD-10-CM | POA: Diagnosis not present

## 2020-02-03 DIAGNOSIS — E7801 Familial hypercholesterolemia: Secondary | ICD-10-CM | POA: Diagnosis not present

## 2020-02-03 DIAGNOSIS — I10 Essential (primary) hypertension: Secondary | ICD-10-CM | POA: Diagnosis not present

## 2020-03-04 DIAGNOSIS — M65331 Trigger finger, right middle finger: Secondary | ICD-10-CM | POA: Diagnosis not present

## 2020-03-04 DIAGNOSIS — M65332 Trigger finger, left middle finger: Secondary | ICD-10-CM | POA: Diagnosis not present

## 2020-04-02 ENCOUNTER — Other Ambulatory Visit: Payer: Self-pay | Admitting: Hematology and Oncology

## 2020-04-02 ENCOUNTER — Inpatient Hospital Stay: Payer: BC Managed Care – PPO | Attending: Oncology

## 2020-04-02 ENCOUNTER — Inpatient Hospital Stay: Payer: BC Managed Care – PPO

## 2020-04-02 ENCOUNTER — Other Ambulatory Visit: Payer: Self-pay

## 2020-04-02 ENCOUNTER — Telehealth: Payer: Self-pay | Admitting: Oncology

## 2020-04-02 DIAGNOSIS — C9201 Acute myeloblastic leukemia, in remission: Secondary | ICD-10-CM | POA: Diagnosis not present

## 2020-04-02 DIAGNOSIS — C9210 Chronic myeloid leukemia, BCR/ABL-positive, not having achieved remission: Secondary | ICD-10-CM

## 2020-04-02 LAB — CBC AND DIFFERENTIAL
HCT: 42 (ref 41–53)
Hemoglobin: 14.7 (ref 13.5–17.5)
Neutrophils Absolute: 3.32
Platelets: 236 (ref 150–399)
WBC: 6.5

## 2020-04-02 LAB — CBC: RBC: 4.8 (ref 3.87–5.11)

## 2020-04-02 NOTE — Telephone Encounter (Signed)
04/04/19 Spoke with patient and rs lab appt

## 2020-04-06 DIAGNOSIS — M65331 Trigger finger, right middle finger: Secondary | ICD-10-CM | POA: Diagnosis not present

## 2020-04-06 DIAGNOSIS — M65332 Trigger finger, left middle finger: Secondary | ICD-10-CM | POA: Diagnosis not present

## 2020-04-09 ENCOUNTER — Telehealth: Payer: Self-pay | Admitting: Oncology

## 2020-04-09 ENCOUNTER — Other Ambulatory Visit: Payer: Self-pay | Admitting: Oncology

## 2020-04-09 ENCOUNTER — Inpatient Hospital Stay (INDEPENDENT_AMBULATORY_CARE_PROVIDER_SITE_OTHER): Payer: BC Managed Care – PPO | Admitting: Oncology

## 2020-04-09 ENCOUNTER — Other Ambulatory Visit: Payer: Self-pay

## 2020-04-09 VITALS — BP 135/83 | HR 82 | Temp 97.5°F | Resp 16 | Ht 70.5 in | Wt 232.2 lb

## 2020-04-09 DIAGNOSIS — C9210 Chronic myeloid leukemia, BCR/ABL-positive, not having achieved remission: Secondary | ICD-10-CM

## 2020-04-09 NOTE — Telephone Encounter (Signed)
Per 1/28 los next appt sched and given to patient 

## 2020-04-13 LAB — BCR-ABL1, CML/ALL, PCR, QUANT: Interpretation (BCRAL):: NEGATIVE

## 2020-04-14 ENCOUNTER — Other Ambulatory Visit: Payer: Self-pay

## 2020-04-14 NOTE — Progress Notes (Signed)
Steven [name]  [address] [place],  [place]  [address] [phone]  Clinic Day:  04/14/2020  Referring physician: Enid Skeens., MD   HISTORY OF PRESENT ILLNESS:  The patient is a 60 y.o. 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  He has been on dasatinib 50 mg daily, from which he has had a complete hematologic, cytogenetic, and molecular response.  He comes in today for routine followup.  Since his last visit, the patient has been doing well.  He continues to tolerate his dasatinib therapy very well.  He denies having any systemic symptoms which concern him for his CML being refractory to his dasatinib.    PHYSICAL EXAM:  Blood pressure 135/83, pulse 82, temperature (!) 97.5 F (36.4 C), resp. rate 16, height 5' 10.5" (1.791 m), weight 232 lb 3.2 oz (105.3 kg), SpO2 94 %. Wt Readings from Last 3 Encounters:  04/09/20 232 lb 3.2 oz (105.3 kg)  05/31/16 222 lb (100.7 kg)  03/09/15 210 lb (95.3 kg)   Body mass index is 32.85 kg/m. Performance status (ECOG): 0 - Asymptomatic Physical Exam Constitutional:      Appearance: Normal appearance. He is not ill-appearing.  HENT:     Mouth/Throat:     Mouth: Mucous membranes are moist.     Pharynx: Oropharynx is clear. No oropharyngeal exudate or posterior oropharyngeal erythema.  Cardiovascular:     Rate and Rhythm: Normal rate and regular rhythm.     Heart sounds: No murmur heard. No friction rub. No gallop.   Pulmonary:     Effort: Pulmonary effort is normal. No respiratory distress.     Breath sounds: Normal breath sounds. No wheezing, rhonchi or rales.  Chest:  Breasts:     Right: No axillary adenopathy or supraclavicular adenopathy.     Left: No axillary adenopathy or supraclavicular adenopathy.    Abdominal:     General: Bowel sounds are normal. There is no distension.     Palpations: Abdomen is soft. There is no mass.     Tenderness: There is no  abdominal tenderness.  Musculoskeletal:        General: No swelling.     Right lower leg: No edema.     Left lower leg: No edema.  Lymphadenopathy:     Cervical: No cervical adenopathy.     Upper Body:     Right upper body: No supraclavicular or axillary adenopathy.     Left upper body: No supraclavicular or axillary adenopathy.     Lower Body: No right inguinal adenopathy. No left inguinal adenopathy.  Skin:    General: Skin is warm.     Coloration: Skin is not jaundiced.     Findings: No lesion or rash.  Neurological:     General: No focal deficit present.     Mental Status: He is alert and oriented to person, place, and time. Mental status is at baseline.     Cranial Nerves: Cranial nerves are intact.  Psychiatric:        Mood and Affect: Mood normal.        Behavior: Behavior normal.        Thought Content: Thought content normal.     LABS:   CBC Latest Ref Rng & Units 04/02/2020 06/26/2017 03/09/2015  WBC - 6.5 7.1 20.2(H)  Hemoglobin 13.5 - 17.5 14.7 15.2 19.0(H)  Hematocrit 41 - 53 42 43.3 53.5(H)  Platelets 150 - 399 236 221 224  CMP Latest Ref Rng & Units 06/26/2017 03/09/2015 02/15/2012  Glucose 65 - 99 mg/dL 92 148(H) 90  BUN 6 - 20 mg/dL 12 20 21   Creatinine 0.61 - 1.24 mg/dL 1.10 1.50(H) 1.24  Sodium 135 - 145 mmol/L 136 139 140  Potassium 3.5 - 5.1 mmol/L 3.5 4.2 4.0  Chloride 101 - 111 mmol/L 105 107 103  CO2 22 - 32 mmol/L 21(L) 20(L) 25  Calcium 8.9 - 10.3 mg/dL 9.4 9.6 9.9  Total Protein 6.5 - 8.1 g/dL 7.1 7.4 7.5  Total Bilirubin 0.3 - 1.2 mg/dL 0.8 0.9 0.4  Alkaline Phos 38 - 126 U/L 66 72 73  AST 15 - 41 U/L 35 30 37  ALT 17 - 63 U/L 34 35 31     Ref Range & Units 12 d ago  b2a2 transcript % Comment   Comment:      <0.0032 %  b3a2 transcript % Comment   Comment:      <0.0032 %  E1A2 Transcript % Comment   Comment:      <0.0032 %  Interpretation (BCRAL):  Negative   Comment: (NOTE)  NEGATIVE for the BCR-ABL1 e1a2 (p190),  e13a2 (b2a2, p210) and e14a2  (b3a2, p210) fusion transcripts. These results do not rule out the  presence of rare BCR-ABL1 transcripts not detected by this assay.       ASSESSMENT & PLAN:   Assessment/Plan:  A 60 y.o. male with chronic myelogenous leukemia.  I am extremely pleased as his labs continue to show he is in a complete molecular response.  This reflects the efficacy of his dasatinib at 50 mg.  He will continue to take this medication at 50 mg daily.  As he is doing very well from a CML perspective, I will see him back in another 6 months for repeat clinical assessment.  The patient understands all the plans discussed today and is in agreement with them.    Lyman Balingit Macarthur Critchley, MD

## 2020-08-02 DIAGNOSIS — I10 Essential (primary) hypertension: Secondary | ICD-10-CM | POA: Diagnosis not present

## 2020-08-02 DIAGNOSIS — E7801 Familial hypercholesterolemia: Secondary | ICD-10-CM | POA: Diagnosis not present

## 2020-08-02 DIAGNOSIS — Z79899 Other long term (current) drug therapy: Secondary | ICD-10-CM | POA: Diagnosis not present

## 2020-09-30 ENCOUNTER — Encounter: Payer: Self-pay | Admitting: Hematology and Oncology

## 2020-09-30 ENCOUNTER — Inpatient Hospital Stay: Payer: BC Managed Care – PPO | Attending: Hematology and Oncology

## 2020-09-30 DIAGNOSIS — C9210 Chronic myeloid leukemia, BCR/ABL-positive, not having achieved remission: Secondary | ICD-10-CM | POA: Diagnosis not present

## 2020-09-30 LAB — CBC AND DIFFERENTIAL
HCT: 45 (ref 41–53)
Hemoglobin: 15.3 (ref 13.5–17.5)
Neutrophils Absolute: 4.22
Platelets: 201 (ref 150–399)
WBC: 6.6

## 2020-09-30 LAB — CBC: RBC: 5.09 (ref 3.87–5.11)

## 2020-10-04 ENCOUNTER — Encounter: Payer: Self-pay | Admitting: Oncology

## 2020-10-04 NOTE — Progress Notes (Unsigned)
Received authorization from [facility] for CPT 81206/81207

## 2020-10-07 ENCOUNTER — Inpatient Hospital Stay: Payer: BC Managed Care – PPO

## 2020-10-07 ENCOUNTER — Encounter: Payer: Self-pay | Admitting: Hematology and Oncology

## 2020-10-07 ENCOUNTER — Other Ambulatory Visit: Payer: Self-pay | Admitting: Hematology and Oncology

## 2020-10-07 ENCOUNTER — Inpatient Hospital Stay (INDEPENDENT_AMBULATORY_CARE_PROVIDER_SITE_OTHER): Payer: BC Managed Care – PPO | Admitting: Hematology and Oncology

## 2020-10-07 ENCOUNTER — Other Ambulatory Visit: Payer: Self-pay

## 2020-10-07 VITALS — BP 130/77 | HR 63 | Temp 97.8°F | Resp 18 | Ht 70.5 in | Wt 225.2 lb

## 2020-10-07 DIAGNOSIS — R140 Abdominal distension (gaseous): Secondary | ICD-10-CM | POA: Diagnosis not present

## 2020-10-07 DIAGNOSIS — C9210 Chronic myeloid leukemia, BCR/ABL-positive, not having achieved remission: Secondary | ICD-10-CM

## 2020-10-07 NOTE — Progress Notes (Signed)
[facility]  [address] [place],  [place]  [address] [phone]  Clinic Day:  10/07/2020  Referring physician: Enid Skeens., MD   HISTORY OF PRESENT ILLNESS:  The patient is a 60 y.o. 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  He has been on dasatinib 50 mg daily, from which he has had a complete hematologic, cytogenetic, and molecular response.  He comes in today for routine followup.  Since his last visit, the patient has been doing well.  He continues to tolerate his dasatinib therapy very well. He does complain of left sided abdominal pain and swelling and pain which is new over the last several days. He denies any known trauma to the area. He denies fever, chills, nausea or vomiting. He denies shortness or breath, chest pain or cough. He denies constipation or diarrhea. He does have continued numbness and tingling in his hands which is being followed by orthopedics due to a "trigger finger issue" that he feels is going to eventually require surgery.  PHYSICAL EXAM:  Blood pressure 130/77, pulse 63, temperature 97.8 F (36.6 C), temperature source Oral, resp. rate 18, height 5' 10.5" (1.791 m), weight 225 lb 3.2 oz (102.2 kg), SpO2 95 %. Wt Readings from Last 3 Encounters:  10/07/20 225 lb 3.2 oz (102.2 kg)  04/09/20 232 lb 3.2 oz (105.3 kg)  05/31/16 222 lb (100.7 kg)   Body mass index is 31.86 kg/m. Performance status (ECOG): 0 - Asymptomatic Physical Exam Constitutional:      Appearance: Normal appearance. He is not ill-appearing.  HENT:     Mouth/Throat:     Mouth: Mucous membranes are moist.     Pharynx: Oropharynx is clear. No oropharyngeal exudate or posterior oropharyngeal erythema.  Cardiovascular:     Rate and Rhythm: Normal rate and regular rhythm.     Heart sounds: No murmur heard.   No friction rub. No gallop.  Pulmonary:     Effort: Pulmonary effort is normal. No respiratory distress.      Breath sounds: Normal breath sounds. No wheezing, rhonchi or rales.  Chest:  Breasts:    Right: No axillary adenopathy or supraclavicular adenopathy.     Left: No axillary adenopathy or supraclavicular adenopathy.  Abdominal:     General: Bowel sounds are normal. There is no distension.     Palpations: There is no mass.     Tenderness: There is abdominal tenderness (left sided).     Comments: Left sided swelling noted.  Musculoskeletal:        General: No swelling.     Right lower leg: No edema.     Left lower leg: No edema.  Lymphadenopathy:     Cervical: No cervical adenopathy.     Upper Body:     Right upper body: No supraclavicular or axillary adenopathy.     Left upper body: No supraclavicular or axillary adenopathy.     Lower Body: No right inguinal adenopathy. No left inguinal adenopathy.  Skin:    General: Skin is warm.     Coloration: Skin is not jaundiced.     Findings: No lesion or rash.  Neurological:     General: No focal deficit present.     Mental Status: He is alert and oriented to person, place, and time. Mental status is at baseline.     Cranial Nerves: Cranial nerves are intact.  Psychiatric:        Mood and Affect: Mood  normal.        Behavior: Behavior normal.        Thought Content: Thought content normal.    LABS:   CBC Latest Ref Rng & Units 09/30/2020 04/02/2020 06/26/2017  WBC - 6.6 6.5 7.1  Hemoglobin 13.5 - 17.5 15.3 14.7 15.2  Hematocrit 41 - 53 45 42 43.3  Platelets 150 - 399 201 236 221   CMP Latest Ref Rng & Units 06/26/2017 03/09/2015 02/15/2012  Glucose 65 - 99 mg/dL 92 148(H) 90  BUN 6 - 20 mg/dL '12 20 21  '$ Creatinine 0.61 - 1.24 mg/dL 1.10 1.50(H) 1.24  Sodium 135 - 145 mmol/L 136 139 140  Potassium 3.5 - 5.1 mmol/L 3.5 4.2 4.0  Chloride 101 - 111 mmol/L 105 107 103  CO2 22 - 32 mmol/L 21(L) 20(L) 25  Calcium 8.9 - 10.3 mg/dL 9.4 9.6 9.9  Total Protein 6.5 - 8.1 g/dL 7.1 7.4 7.5  Total Bilirubin 0.3 - 1.2 mg/dL 0.8 0.9 0.4   Alkaline Phos 38 - 126 U/L 66 72 73  AST 15 - 41 U/L 35 30 37  ALT 17 - 63 U/L 34 35 31     Ref Range & Units 12 d ago  b2a2 transcript % Comment   Comment:            <0.0032 %  b3a2 transcript % Comment   Comment:            <0.0032 %  E1A2 Transcript % Comment   Comment:            <0.0032 %  Interpretation (BCRAL):  Negative   Comment: (NOTE)  NEGATIVE for the BCR-ABL1 e1a2 (p190), e13a2 (b2a2, p210) and e14a2  (b3a2, p210) fusion transcripts. These results do not rule out the  presence of rare BCR-ABL1 transcripts not detected by this assay.       ASSESSMENT & PLAN:   Assessment/Plan:  A 60 y.o. male with chronic myelogenous leukemia. His lab results reveal a good response to dasatinib and he appears to be tolerating well. He will continue oral dasatinib 50 mg daily. I will order and obtain authorization for CT abdomen/ pelvis to assess new left sided pain and swelling. We will discuss results by telephone. He will return to clinic in 6 months for re-evaluation.   He verbalizes understanding of and agreement to the plans discussed today. He knows to call the office should any new questions or concerns arise.   Melodye Ped, NP

## 2020-10-14 DIAGNOSIS — J209 Acute bronchitis, unspecified: Secondary | ICD-10-CM | POA: Diagnosis not present

## 2020-10-14 DIAGNOSIS — Z20828 Contact with and (suspected) exposure to other viral communicable diseases: Secondary | ICD-10-CM | POA: Diagnosis not present

## 2021-01-14 DIAGNOSIS — R0789 Other chest pain: Secondary | ICD-10-CM | POA: Diagnosis not present

## 2021-01-14 DIAGNOSIS — R1013 Epigastric pain: Secondary | ICD-10-CM | POA: Diagnosis not present

## 2021-01-18 DIAGNOSIS — R079 Chest pain, unspecified: Secondary | ICD-10-CM | POA: Diagnosis not present

## 2021-01-18 DIAGNOSIS — R0602 Shortness of breath: Secondary | ICD-10-CM | POA: Diagnosis not present

## 2021-01-18 DIAGNOSIS — R0789 Other chest pain: Secondary | ICD-10-CM | POA: Diagnosis not present

## 2021-02-01 DIAGNOSIS — R0789 Other chest pain: Secondary | ICD-10-CM | POA: Diagnosis not present

## 2021-02-01 DIAGNOSIS — R1011 Right upper quadrant pain: Secondary | ICD-10-CM | POA: Diagnosis not present

## 2021-02-01 DIAGNOSIS — R1013 Epigastric pain: Secondary | ICD-10-CM | POA: Diagnosis not present

## 2021-02-01 DIAGNOSIS — R0602 Shortness of breath: Secondary | ICD-10-CM | POA: Diagnosis not present

## 2021-02-03 ENCOUNTER — Inpatient Hospital Stay (HOSPITAL_COMMUNITY)
Admission: EM | Admit: 2021-02-03 | Discharge: 2021-02-05 | DRG: 247 | Disposition: A | Discharge status: Home / Self Care | Payer: BC Managed Care – PPO | Attending: Cardiovascular Disease | Admitting: Cardiovascular Disease

## 2021-02-03 ENCOUNTER — Emergency Department (HOSPITAL_COMMUNITY): Payer: BC Managed Care – PPO

## 2021-02-03 ENCOUNTER — Inpatient Hospital Stay (HOSPITAL_COMMUNITY)
Admission: EM | Disposition: A | Discharge status: Home / Self Care | Payer: Self-pay | Source: Home / Self Care | Attending: Cardiovascular Disease

## 2021-02-03 ENCOUNTER — Other Ambulatory Visit: Payer: Self-pay

## 2021-02-03 ENCOUNTER — Encounter (HOSPITAL_COMMUNITY): Payer: Self-pay | Admitting: Pharmacy Technician

## 2021-02-03 DIAGNOSIS — Z79899 Other long term (current) drug therapy: Secondary | ICD-10-CM | POA: Diagnosis not present

## 2021-02-03 DIAGNOSIS — Z7982 Long term (current) use of aspirin: Secondary | ICD-10-CM

## 2021-02-03 DIAGNOSIS — I2510 Atherosclerotic heart disease of native coronary artery without angina pectoris: Secondary | ICD-10-CM | POA: Diagnosis present

## 2021-02-03 DIAGNOSIS — I214 Non-ST elevation (NSTEMI) myocardial infarction: Principal | ICD-10-CM | POA: Diagnosis present

## 2021-02-03 DIAGNOSIS — N182 Chronic kidney disease, stage 2 (mild): Secondary | ICD-10-CM | POA: Diagnosis not present

## 2021-02-03 DIAGNOSIS — Z955 Presence of coronary angioplasty implant and graft: Secondary | ICD-10-CM | POA: Diagnosis not present

## 2021-02-03 DIAGNOSIS — I241 Dressler's syndrome: Secondary | ICD-10-CM | POA: Diagnosis not present

## 2021-02-03 DIAGNOSIS — I25110 Atherosclerotic heart disease of native coronary artery with unstable angina pectoris: Secondary | ICD-10-CM | POA: Diagnosis not present

## 2021-02-03 DIAGNOSIS — I129 Hypertensive chronic kidney disease with stage 1 through stage 4 chronic kidney disease, or unspecified chronic kidney disease: Secondary | ICD-10-CM | POA: Diagnosis present

## 2021-02-03 DIAGNOSIS — C9210 Chronic myeloid leukemia, BCR/ABL-positive, not having achieved remission: Secondary | ICD-10-CM | POA: Diagnosis present

## 2021-02-03 DIAGNOSIS — E785 Hyperlipidemia, unspecified: Secondary | ICD-10-CM | POA: Diagnosis not present

## 2021-02-03 DIAGNOSIS — R079 Chest pain, unspecified: Secondary | ICD-10-CM | POA: Diagnosis not present

## 2021-02-03 DIAGNOSIS — Z7902 Long term (current) use of antithrombotics/antiplatelets: Secondary | ICD-10-CM | POA: Diagnosis not present

## 2021-02-03 DIAGNOSIS — I200 Unstable angina: Secondary | ICD-10-CM | POA: Diagnosis not present

## 2021-02-03 DIAGNOSIS — Z8249 Family history of ischemic heart disease and other diseases of the circulatory system: Secondary | ICD-10-CM | POA: Diagnosis not present

## 2021-02-03 DIAGNOSIS — Z87891 Personal history of nicotine dependence: Secondary | ICD-10-CM

## 2021-02-03 DIAGNOSIS — E7800 Pure hypercholesterolemia, unspecified: Secondary | ICD-10-CM | POA: Diagnosis not present

## 2021-02-03 DIAGNOSIS — Z20822 Contact with and (suspected) exposure to covid-19: Secondary | ICD-10-CM | POA: Diagnosis not present

## 2021-02-03 DIAGNOSIS — Z79891 Long term (current) use of opiate analgesic: Secondary | ICD-10-CM

## 2021-02-03 DIAGNOSIS — I10 Essential (primary) hypertension: Secondary | ICD-10-CM | POA: Diagnosis not present

## 2021-02-03 DIAGNOSIS — R072 Precordial pain: Secondary | ICD-10-CM | POA: Diagnosis present

## 2021-02-03 DIAGNOSIS — I25118 Atherosclerotic heart disease of native coronary artery with other forms of angina pectoris: Secondary | ICD-10-CM | POA: Diagnosis present

## 2021-02-03 HISTORY — PX: LEFT HEART CATH AND CORONARY ANGIOGRAPHY: CATH118249

## 2021-02-03 HISTORY — DX: Chronic kidney disease, stage 2 (mild): N18.2

## 2021-02-03 HISTORY — PX: CORONARY/GRAFT ACUTE MI REVASCULARIZATION: CATH118305

## 2021-02-03 LAB — RESP PANEL BY RT-PCR (FLU A&B, COVID) ARPGX2
Influenza A by PCR: NEGATIVE
Influenza B by PCR: NEGATIVE
SARS Coronavirus 2 by RT PCR: NEGATIVE

## 2021-02-03 LAB — CBC WITH DIFFERENTIAL/PLATELET
Abs Immature Granulocytes: 0.01 10*3/uL (ref 0.00–0.07)
Basophils Absolute: 0.1 10*3/uL (ref 0.0–0.1)
Basophils Relative: 2 %
Eosinophils Absolute: 1.1 10*3/uL — ABNORMAL HIGH (ref 0.0–0.5)
Eosinophils Relative: 13 %
HCT: 47 % (ref 39.0–52.0)
Hemoglobin: 16.8 g/dL (ref 13.0–17.0)
Immature Granulocytes: 0 %
Lymphocytes Relative: 29 %
Lymphs Abs: 2.3 10*3/uL (ref 0.7–4.0)
MCH: 30.6 pg (ref 26.0–34.0)
MCHC: 35.7 g/dL (ref 30.0–36.0)
MCV: 85.6 fL (ref 80.0–100.0)
Monocytes Absolute: 0.6 10*3/uL (ref 0.1–1.0)
Monocytes Relative: 7 %
Neutro Abs: 3.9 10*3/uL (ref 1.7–7.7)
Neutrophils Relative %: 49 %
Platelets: 283 10*3/uL (ref 150–400)
RBC: 5.49 MIL/uL (ref 4.22–5.81)
RDW: 13.9 % (ref 11.5–15.5)
WBC: 8.1 10*3/uL (ref 4.0–10.5)
nRBC: 0 % (ref 0.0–0.2)

## 2021-02-03 LAB — COMPREHENSIVE METABOLIC PANEL
ALT: 34 U/L (ref 0–44)
AST: 48 U/L — ABNORMAL HIGH (ref 15–41)
Albumin: 4.3 g/dL (ref 3.5–5.0)
Alkaline Phosphatase: 64 U/L (ref 38–126)
Anion gap: 11 (ref 5–15)
BUN: 17 mg/dL (ref 8–23)
CO2: 20 mmol/L — ABNORMAL LOW (ref 22–32)
Calcium: 9.6 mg/dL (ref 8.9–10.3)
Chloride: 105 mmol/L (ref 98–111)
Creatinine, Ser: 1.28 mg/dL — ABNORMAL HIGH (ref 0.61–1.24)
GFR, Estimated: >60 mL/min (ref >60)
Glucose, Bld: 122 mg/dL — ABNORMAL HIGH (ref 70–99)
Potassium: 4 mmol/L (ref 3.5–5.1)
Sodium: 136 mmol/L (ref 135–145)
Total Bilirubin: 1.4 mg/dL — ABNORMAL HIGH (ref 0.3–1.2)
Total Protein: 7.2 g/dL (ref 6.5–8.1)

## 2021-02-03 LAB — LIPASE, BLOOD: Lipase: 41 U/L (ref 11–51)

## 2021-02-03 LAB — MRSA NEXT GEN BY PCR, NASAL: MRSA by PCR Next Gen: NOT DETECTED

## 2021-02-03 LAB — HEMOGLOBIN A1C
Hgb A1c MFr Bld: 4.6 % — ABNORMAL LOW (ref 4.8–5.6)
Mean Plasma Glucose: 85.32 mg/dL

## 2021-02-03 LAB — TROPONIN I (HIGH SENSITIVITY)
Troponin I (High Sensitivity): 568 ng/L (ref <18)
Troponin I (High Sensitivity): 984 ng/L (ref <18)

## 2021-02-03 LAB — POCT ACTIVATED CLOTTING TIME
Activated Clotting Time: 277 seconds
Activated Clotting Time: 370 seconds

## 2021-02-03 LAB — BRAIN NATRIURETIC PEPTIDE: B Natriuretic Peptide: 33.2 pg/mL (ref 0.0–100.0)

## 2021-02-03 LAB — TSH: TSH: 1.631 u[IU]/mL (ref 0.350–4.500)

## 2021-02-03 SURGERY — CORONARY/GRAFT ACUTE MI REVASCULARIZATION
Anesthesia: LOCAL

## 2021-02-03 MED ORDER — TIROFIBAN HCL IN NACL 5-0.9 MG/100ML-% IV SOLN: 0.1500 ug/kg/min | INTRAVENOUS | Status: AC

## 2021-02-03 MED ORDER — IOHEXOL 350 MG/ML SOLN
INTRAVENOUS | Status: DC | PRN
  Administered 2021-02-03: 165 mL

## 2021-02-03 MED ORDER — FENTANYL CITRATE (PF) 100 MCG/2ML IJ SOLN
INTRAMUSCULAR | Status: AC
  Filled 2021-02-03: qty 2

## 2021-02-03 MED ORDER — ATORVASTATIN CALCIUM 80 MG PO TABS
80.0000 mg | ORAL_TABLET | Freq: Every day | ORAL | Status: DC
  Administered 2021-02-03 – 2021-02-05 (×3): 80 mg via ORAL
  Filled 2021-02-03 (×3): qty 1

## 2021-02-03 MED ORDER — LIDOCAINE HCL (PF) 1 % IJ SOLN
INTRAMUSCULAR | Status: AC
  Filled 2021-02-03: qty 30

## 2021-02-03 MED ORDER — TIROFIBAN (AGGRASTAT) BOLUS VIA INFUSION
INTRAVENOUS | Status: DC | PRN
  Administered 2021-02-03: 2555 ug via INTRAVENOUS

## 2021-02-03 MED ORDER — ASPIRIN 81 MG PO CHEW: 81.0000 mg | CHEWABLE_TABLET | Freq: Every day | ORAL | Status: DC

## 2021-02-03 MED ORDER — ASPIRIN 81 MG PO CHEW
324.0000 mg | CHEWABLE_TABLET | Freq: Once | ORAL | Status: AC
  Administered 2021-02-03: 324 mg via ORAL
  Filled 2021-02-03: qty 4

## 2021-02-03 MED ORDER — METOPROLOL TARTRATE 25 MG PO TABS
25.0000 mg | ORAL_TABLET | Freq: Two times a day (BID) | ORAL | Status: DC
  Administered 2021-02-03 – 2021-02-05 (×4): 25 mg via ORAL
  Filled 2021-02-03 (×4): qty 1

## 2021-02-03 MED ORDER — ONDANSETRON HCL 4 MG/2ML IJ SOLN
4.0000 mg | Freq: Once | INTRAMUSCULAR | Status: AC
  Administered 2021-02-03: 4 mg via INTRAVENOUS
  Filled 2021-02-03: qty 2

## 2021-02-03 MED ORDER — SODIUM CHLORIDE 0.9 % IV SOLN: INTRAVENOUS | Status: AC

## 2021-02-03 MED ORDER — TICAGRELOR 90 MG PO TABS
ORAL_TABLET | ORAL | Status: AC
  Filled 2021-02-03: qty 2

## 2021-02-03 MED ORDER — MIDAZOLAM HCL 2 MG/2ML IJ SOLN
INTRAMUSCULAR | Status: DC | PRN
  Administered 2021-02-03 (×2): 1 mg via INTRAVENOUS

## 2021-02-03 MED ORDER — ACETAMINOPHEN 325 MG PO TABS: 650.0000 mg | ORAL_TABLET | ORAL | Status: DC | PRN

## 2021-02-03 MED ORDER — TICAGRELOR 90 MG PO TABS
90.0000 mg | ORAL_TABLET | Freq: Two times a day (BID) | ORAL | Status: DC
  Administered 2021-02-04 – 2021-02-05 (×3): 90 mg via ORAL
  Filled 2021-02-03 (×4): qty 1

## 2021-02-03 MED ORDER — ASPIRIN EC 81 MG PO TBEC
81.0000 mg | DELAYED_RELEASE_TABLET | Freq: Every day | ORAL | Status: DC
  Administered 2021-02-04 – 2021-02-05 (×2): 81 mg via ORAL
  Filled 2021-02-03 (×2): qty 1

## 2021-02-03 MED ORDER — ASPIRIN 81 MG PO CHEW: 81.0000 mg | CHEWABLE_TABLET | ORAL | Status: DC

## 2021-02-03 MED ORDER — NITROGLYCERIN IN D5W 200-5 MCG/ML-% IV SOLN
0.0000 ug/min | INTRAVENOUS | Status: DC
  Administered 2021-02-03: 5 ug/min via INTRAVENOUS
  Filled 2021-02-03: qty 250

## 2021-02-03 MED ORDER — VERAPAMIL HCL 2.5 MG/ML IV SOLN
INTRAVENOUS | Status: DC | PRN
  Administered 2021-02-03: 10 mL via INTRA_ARTERIAL

## 2021-02-03 MED ORDER — MIDAZOLAM HCL 2 MG/2ML IJ SOLN
INTRAMUSCULAR | Status: AC
  Filled 2021-02-03: qty 2

## 2021-02-03 MED ORDER — VERAPAMIL HCL 2.5 MG/ML IV SOLN
INTRAVENOUS | Status: AC
  Filled 2021-02-03: qty 2

## 2021-02-03 MED ORDER — DASATINIB 50 MG PO TABS
50.0000 mg | ORAL_TABLET | Freq: Every day | ORAL | Status: DC
  Administered 2021-02-04: 50 mg via ORAL

## 2021-02-03 MED ORDER — HEPARIN (PORCINE) IN NACL 1000-0.9 UT/500ML-% IV SOLN
INTRAVENOUS | Status: AC
  Filled 2021-02-03: qty 1000

## 2021-02-03 MED ORDER — LABETALOL HCL 5 MG/ML IV SOLN: 10.0000 mg | INTRAVENOUS | Status: AC | PRN

## 2021-02-03 MED ORDER — SODIUM CHLORIDE 0.9 % IV SOLN: 250.0000 mL | INTRAVENOUS | Status: DC | PRN

## 2021-02-03 MED ORDER — SODIUM CHLORIDE 0.9% FLUSH
3.0000 mL | Freq: Two times a day (BID) | INTRAVENOUS | Status: DC
  Administered 2021-02-03 – 2021-02-04 (×3): 3 mL via INTRAVENOUS

## 2021-02-03 MED ORDER — SODIUM CHLORIDE 0.9% FLUSH: 3.0000 mL | INTRAVENOUS | Status: DC | PRN

## 2021-02-03 MED ORDER — HEPARIN BOLUS VIA INFUSION
4000.0000 [IU] | Freq: Once | INTRAVENOUS | Status: AC
  Administered 2021-02-03: 4000 [IU] via INTRAVENOUS
  Filled 2021-02-03: qty 4000

## 2021-02-03 MED ORDER — TIROFIBAN HCL IN NACL 5-0.9 MG/100ML-% IV SOLN
INTRAVENOUS | Status: AC | PRN
  Administered 2021-02-03: .15 ug/kg/min via INTRAVENOUS

## 2021-02-03 MED ORDER — SODIUM CHLORIDE 0.9% FLUSH
3.0000 mL | Freq: Two times a day (BID) | INTRAVENOUS | Status: DC
  Administered 2021-02-04: 3 mL via INTRAVENOUS

## 2021-02-03 MED ORDER — NITROGLYCERIN 0.4 MG SL SUBL: 0.4000 mg | SUBLINGUAL_TABLET | SUBLINGUAL | Status: DC | PRN

## 2021-02-03 MED ORDER — TIROFIBAN HCL IN NACL 5-0.9 MG/100ML-% IV SOLN
INTRAVENOUS | Status: AC
  Filled 2021-02-03: qty 100

## 2021-02-03 MED ORDER — ONDANSETRON HCL 4 MG/2ML IJ SOLN: 4.0000 mg | Freq: Four times a day (QID) | INTRAMUSCULAR | Status: DC | PRN

## 2021-02-03 MED ORDER — MORPHINE SULFATE (PF) 4 MG/ML IV SOLN
4.0000 mg | Freq: Once | INTRAVENOUS | Status: AC
  Administered 2021-02-03: 4 mg via INTRAVENOUS
  Filled 2021-02-03: qty 1

## 2021-02-03 MED ORDER — HEPARIN (PORCINE) 25000 UT/250ML-% IV SOLN
1250.0000 [IU]/h | INTRAVENOUS | Status: DC
  Administered 2021-02-03: 1250 [IU]/h via INTRAVENOUS
  Filled 2021-02-03: qty 250

## 2021-02-03 MED ORDER — HYDRALAZINE HCL 20 MG/ML IJ SOLN: 10.0000 mg | INTRAMUSCULAR | Status: AC | PRN

## 2021-02-03 MED ORDER — HEPARIN SODIUM (PORCINE) 1000 UNIT/ML IJ SOLN
INTRAMUSCULAR | Status: AC
  Filled 2021-02-03: qty 10

## 2021-02-03 MED ORDER — LIDOCAINE HCL (PF) 1 % IJ SOLN
INTRAMUSCULAR | Status: DC | PRN
  Administered 2021-02-03: 2 mL

## 2021-02-03 MED ORDER — FENTANYL CITRATE (PF) 100 MCG/2ML IJ SOLN
INTRAMUSCULAR | Status: DC | PRN
  Administered 2021-02-03: 25 ug via INTRAVENOUS
  Administered 2021-02-03: 50 ug via INTRAVENOUS

## 2021-02-03 MED ORDER — SODIUM CHLORIDE 0.9 % WEIGHT BASED INFUSION: 1.0000 mL/kg/h | INTRAVENOUS | Status: DC

## 2021-02-03 MED ORDER — HEPARIN SODIUM (PORCINE) 1000 UNIT/ML IJ SOLN
INTRAMUSCULAR | Status: DC | PRN
  Administered 2021-02-03: 2000 [IU] via INTRAVENOUS
  Administered 2021-02-03: 10000 [IU] via INTRAVENOUS

## 2021-02-03 MED ORDER — TICAGRELOR 90 MG PO TABS
ORAL_TABLET | ORAL | Status: DC | PRN
  Administered 2021-02-03: 180 mg via ORAL

## 2021-02-03 MED ORDER — SODIUM CHLORIDE 0.9 % WEIGHT BASED INFUSION: 3.0000 mL/kg/h | INTRAVENOUS | Status: DC

## 2021-02-03 SURGICAL SUPPLY — 18 items
BALLN SAPPHIRE 2.0X12 (BALLOONS) ×2
BALLN SAPPHIRE ~~LOC~~ 3.25X12 (BALLOONS) ×1 IMPLANT
BALLN SAPPHIRE ~~LOC~~ 3.5X12 (BALLOONS) ×1 IMPLANT
BALLOON SAPPHIRE 2.0X12 (BALLOONS) IMPLANT
CATH 5FR JL3.5 JR4 ANG PIG MP (CATHETERS) ×2 IMPLANT
CATH VISTA GUIDE 6FR XBLAD3.5 (CATHETERS) ×1 IMPLANT
DEVICE RAD COMP TR BAND LRG (VASCULAR PRODUCTS) ×1 IMPLANT
GLIDESHEATH SLEND SS 6F .021 (SHEATH) ×1 IMPLANT
GUIDEWIRE INQWIRE 1.5J.035X260 (WIRE) IMPLANT
INQWIRE 1.5J .035X260CM (WIRE) ×2
KIT ENCORE 26 ADVANTAGE (KITS) ×1 IMPLANT
KIT HEART LEFT (KITS) ×2 IMPLANT
PACK CARDIAC CATHETERIZATION (CUSTOM PROCEDURE TRAY) ×2 IMPLANT
STENT SYNERGY XD 3.0X16 (Permanent Stent) IMPLANT
SYNERGY XD 3.0X16 (Permanent Stent) ×4 IMPLANT
TRANSDUCER W/STOPCOCK (MISCELLANEOUS) ×2 IMPLANT
TUBING CIL FLEX 10 FLL-RA (TUBING) ×2 IMPLANT
WIRE HI TORQ BMW 190CM (WIRE) ×1 IMPLANT

## 2021-02-03 NOTE — ED Triage Notes (Signed)
Pt here with CP onset 3-4 days ago. Endorses shob along with pain. Pain radiates into his L arm. Pt states he had blood work done a few days ago and "a lot of things were elevated". Pt visibly uncomfortable in triage.

## 2021-02-03 NOTE — H&P (Signed)
Cardiology Admission History and Physical:   Patient ID: Steven [name] MRN: [id number]; DOB: [birth year]   Admission date: 02/03/2021  PCP:  Enid Skeens., MD   [facility] Providers Cardiologist:  Evalina Field, MD    Chief Complaint:  NSTEMI  Patient Profile:   Steven [name] is a 60 y.o. male with CML controlled, HTN, HLD, and former smoker who is being seen 02/03/2021 for the evaluation of chest pain.  History of Present Illness:   Steven [name] has a smoking history but has not smoked in 17 years. He has a strong family history of CAD (paternal grandfather with MI in his 66s). He has a history of CAD by heart cath (2013). He has controlled HTN and HLD. Starting about 2 weeks ago, he developed exertional chest pain improved with rest. Saturday night chest pain woke him from sleep. CP persisted for 2 hours, radiated down both arms, associated with shortness of breath, nausea with episodes of vomiting. He drank three bottles of water thinking he was having gallbladder problems, CP finally eased. He continued to have chest pain waxing and waning in intensity. Today, he had 10/10 chest pain with vomiting, SOB, and diaphoresis. His brother drove him to [facility] for evaluation.   On arrival, EKG showed ST elevation in AVR with ST depression in inferior and precordial leads.  Initial at bedtime troponin 568.  Hemodynamically stable.  Bedside echocardiogram performed by myself demonstrates normal LV and RV function.  No regional wall motion abnormalities however this was a difficult study.  Formal echo is pending.  Labs notable for serum creatinine 1.28 which is at baseline.  Serum bicarbonate 20.  No reason for acidosis.  AST 48 ALT 34.  T bili slightly elevated 1.4.  GFR greater than 60.  He is not anemic.  Hemoglobin 16.8.  Platelet stable at 283.  Chest x-ray clear.  He was on a nitroglycerin drip during my examination.  Continues to have 3-4 out of 10 chest pain and pressure.   We discussed pursuing left heart catheterization today given ongoing symptoms and very alarming EKG changes.  He is willing to do this.  Of note Steven [name] has CML.  This apparently is well controlled on his current chemotherapeutic agent.  This is in pill form.  This is a federal prognosis and does not preclude him from left heart catheterization.  Past Medical History:  Diagnosis Date   Chest pain    a. Cath 2007:  luminal irregs, normal LVF (Dr. Doylene Canard) - was placed on plavix afterwards.;   b.  ETT-MV 11/13:   mild inferior ischemia, EF 59%.   CML (chronic myeloid leukemia) ([place])    a. Dx 2011 - followed @ [facility] - remission   Hepatitis    IN THE 1980'S   Hyperlipidemia    a. Dx 2006   Hypertension    a. Dx 2006   Pneumonia    hx of PNA    Past Surgical History:  Procedure Laterality Date   CARDIAC CATHETERIZATION  02/16/2012   30% tubular prox RCA stenosis, normal coronaries otherwise; LVEF 65%, no WMAs   LEFT HEART CATHETERIZATION WITH CORONARY ANGIOGRAM N/A 02/16/2012   Procedure: LEFT HEART CATHETERIZATION WITH CORONARY ANGIOGRAM;  Surgeon: Josue Hector, MD;  Location: [facility] CATH LAB;  Service: Cardiovascular;  Laterality: N/A;   Left Rotator Cuff Repair     a. approx. 2003     Medications Prior to Admission: Prior to Admission medications  Medication Sig Start Date End Date Taking? Authorizing Provider  aspirin 81 MG chewable tablet Chew 1 tablet (81 mg total) by mouth daily. 02/16/12   Arguello, Roger A, PA-C  atenolol (TENORMIN) 25 MG tablet Take 25 mg by mouth daily.    [provider]  dasatinib (SPRYCEL) 50 MG tablet Take 50 mg by mouth daily. 05/10/18   [provider]  hydrochlorothiazide (HYDRODIURIL) 25 MG tablet Take 1 tablet (25 mg total) by mouth daily. 06/11/12   Bensimhon, Shaune Pascal, MD  ondansetron (ZOFRAN) 4 MG tablet Take 1 tablet (4 mg total) by mouth every 6 (six) hours. 06/26/17   Hedges, Dellis Filbert, PA-C  potassium chloride  (K-DUR) 10 MEQ tablet Take 2 tablets (20 mEq total) by mouth daily. 05/10/12   Richardson Dopp T, PA-C  pravastatin (PRAVACHOL) 20 MG tablet Take 20 mg by mouth every evening.    [provider]  traMADol (ULTRAM) 50 MG tablet Take 1 tablet (50 mg total) by mouth every 8 (eight) hours as needed. 05/31/16   Alveda Reasons, MD     Allergies:   No Known Allergies  Social History:   Social History   Socioeconomic History   Marital status: Divorced    Spouse name: Not on file   Number of children: Not on file   Years of education: Not on file   Highest education level: Not on file  Occupational History   Not on file  Tobacco Use   Smoking status: Former    Packs/day: 1.00    Years: 30.00    Pack years: 30.00    Types: Cigarettes    Quit date: 02/14/2005    Years since quitting: 15.9   Smokeless tobacco: Never   Tobacco comments:    smoked 1-1/12 ppd for better part of 30 years  Substance and Sexual Activity   Alcohol use: Yes    Comment: Rare drink   Drug use: No   Sexual activity: Not on file  Other Topics Concern   Not on file  Social History Narrative   Lives in [place] by himself.  Works as an Clinical biochemist.   Social Determinants of Health   Financial Resource Strain: Not on file  Food Insecurity: Not on file  Transportation Needs: Not on file  Physical Activity: Not on file  Stress: Not on file  Social Connections: Not on file  Intimate Partner Violence: Not on file    Family History:   The patient's family history includes Heart attack in his father; Other in his brother and mother.    ROS:  Please see the history of present illness.  All other ROS reviewed and negative.     Physical Exam/Data:   Vitals:   02/03/21 1600 02/03/21 1615 02/03/21 1630 02/03/21 1645  BP: (!) 138/96 126/88 131/87 126/79  Pulse: 77 74 67 72  Resp: (!) 21 14 14 14   Temp:    98.3 F (36.8 C)  TempSrc:    Oral  SpO2: 99% 97% 99% 96%  Weight:      Height:       No  intake or output data in the 24 hours ending 02/03/21 1652 Last 3 Weights 02/03/2021 10/07/2020 04/09/2020  Weight (lbs) 225 lb 5 oz 225 lb 3.2 oz 232 lb 3.2 oz  Weight (kg) 102.2 kg 102.15 kg 105.325 kg     Body mass index is 31.87 kg/m.  General:  Well nourished, well developed, in no acute distress HEENT: normal Neck: no  JVD Vascular: No carotid bruits; Distal pulses 2+ bilaterally   Cardiac:  normal S1, S2; RRR; no murmur  Lungs:  clear to auscultation bilaterally, no wheezing, rhonchi or rales  Abd: soft, nontender, no hepatomegaly  Ext: no edema Musculoskeletal:  No deformities, BUE and BLE strength normal and equal Skin: warm and dry  Neuro:  CNs 2-12 intact, no focal abnormalities noted Psych:  Normal affect    EKG:  The ECG that was done  was personally reviewed and demonstrates sinus rhythm with HR 94, STE AVR, ST depression inferior leads and throughout precordial leads  Relevant CV Studies: Echo pending   Laboratory Data:  High Sensitivity Troponin:   Recent Labs  Lab 02/03/21 1449  TROPONINIHS 568*      Chemistry Recent Labs  Lab 02/03/21 1449  NA 136  K 4.0  CL 105  CO2 20*  GLUCOSE 122*  BUN 17  CREATININE 1.28*  CALCIUM 9.6  GFRNONAA >60  ANIONGAP 11    Recent Labs  Lab 02/03/21 1449  PROT 7.2  ALBUMIN 4.3  AST 48*  ALT 34  ALKPHOS 64  BILITOT 1.4*   Lipids No results for input(s): CHOL, TRIG, HDL, LABVLDL, LDLCALC, CHOLHDL in the last 168 hours. Hematology Recent Labs  Lab 02/03/21 1449  WBC 8.1  RBC 5.49  HGB 16.8  HCT 47.0  MCV 85.6  MCH 30.6  MCHC 35.7  RDW 13.9  PLT 283   Thyroid No results for input(s): TSH, FREET4 in the last 168 hours. BNPNo results for input(s): BNP, PROBNP in the last 168 hours.  DDimer No results for input(s): DDIMER in the last 168 hours.   Radiology/Studies:  DG Chest Portable 1 View  Result Date: 02/03/2021 CLINICAL DATA:  Chest pain EXAM: PORTABLE CHEST 1 VIEW COMPARISON:  January 18, 2021  FINDINGS: The heart size and mediastinal contours are stable. Both lungs are clear. The visualized skeletal structures are unremarkable. IMPRESSION: No active disease. Electronically Signed   By: Abelardo Diesel M.D.   On: 02/03/2021 15:28     Assessment and Plan:   #NSTEMI with high risk features -He presents with 2 weeks of worsening exertional chest pressure that is now occurring at rest.  EKG is concerning for ST elevation in aVR with reciprocal ST depressions diffusely.  This is very alarming for possible left main disease.  Bedside echo performed by me although very difficult demonstrates normal LV and RV function.  Initial high-sensitivity troponin is indicative of myocardial injury. -He is currently on a nitroglycerin drip as well as heparin drip.  He continues to have 3 out of 10 chest pressure at rest.  I discussed his case with Dr. Darlina Guys, interventional cardiology on-call.  Giving persistent symptoms on nitroglycerin drip as well as very alarming EKG we will proceed with left heart catheterization today.  The patient is willing and understands the risks. -In the interim we will continue aspirin therapy.  We will start him on high intensity statin therapy.  Blood pressure is stable and we will transition him to metoprolol tartrate 25 mg twice daily. -He will need full panel of labs including A1c, lipid profile and TSH.  We have ordered a repeat echocardiogram that will be done formally.  Shared Decision Making/Informed Consent The risks [stroke (1 in 1000), death (1 in 1000), kidney failure [usually temporary] (1 in 500), bleeding (1 in 200), allergic reaction [possibly serious] (1 in 200)], benefits (diagnostic support and management of coronary artery disease) and alternatives of  a cardiac catheterization were discussed in detail with Steven [name] and he is willing to proceed.  #HTN -Transition atenolol to metoprolol. -Hold HCTZ.  #HLD -Transition to Lipitor 80 mg  daily.  #CML -Continue Dasatinib.  FEN -pre cath IVF -NPO -dvt ppx: heparin drip -code: full  Risk Assessment/Risk Scores:    TIMI Risk Score for Unstable Angina or Non-ST Elevation MI:   The patient's TIMI risk score is 5, which indicates a 26% risk of all cause mortality, new or recurrent myocardial infarction or need for urgent revascularization in the next 14 days.{  Severity of Illness: The appropriate patient status for this patient is INPATIENT. Inpatient status is judged to be reasonable and necessary in order to provide the required intensity of service to ensure the patient's safety. The patient's presenting symptoms, physical exam findings, and initial radiographic and laboratory data in the context of their chronic comorbidities is felt to place them at high risk for further clinical deterioration. Furthermore, it is not anticipated that the patient will be medically stable for discharge from the hospital within 2 midnights of admission.   * I certify that at the point of admission it is my clinical judgment that the patient will require inpatient hospital care spanning beyond 2 midnights from the point of admission due to high intensity of service, high risk for further deterioration and high frequency of surveillance required.*   For questions or updates, please contact [place] Please consult [email or web address] for contact info under   [employer]. Audie Box, MD, [place]  [address] [place], [place] [address] [phone]  4:52 PM

## 2021-02-03 NOTE — Progress Notes (Signed)
ANTICOAGULATION CONSULT NOTE - Initial Consult  Pharmacy Consult for heparin Indication: chest pain/ACS  No Known Allergies  Patient Measurements: Height: 5' 10.5" (179.1 cm) Weight: 102.2 kg (225 lb 5 oz) IBW/kg (Calculated) : 74.15 Heparin Dosing Weight: 95.5 kg  Vital Signs: Temp: 98.9 F (37.2 C) (11/24 1439) BP: 152/96 (11/24 1445) Pulse Rate: 109 (11/24 1445)  Labs: No results for input(s): HGB, HCT, PLT, APTT, LABPROT, INR, HEPARINUNFRC, HEPRLOWMOCWT, CREATININE, CKTOTAL, CKMB, TROPONINIHS in the last 72 hours.  CrCl cannot be calculated (Patient's most recent lab result is older than the maximum 21 days allowed.).   Medical History: Past Medical History:  Diagnosis Date   Chest pain    a. Cath 2007:  luminal irregs, normal LVF (Dr. Doylene Canard) - was placed on plavix afterwards.;   b.  ETT-MV 11/13:   mild inferior ischemia, EF 59%.   CML (chronic myeloid leukemia) ([employer])    a. Dx 2011 - followed @ [facility] - remission   Hepatitis    IN THE 1980'S   Hyperlipidemia    a. Dx 2006   Hypertension    a. Dx 2006   Pneumonia    hx of PNA    Medications: see MAR  Assessment: 60 yo M with CP - heparin consult for [facility]. No AC PTA. CBC pending.   Goal of Therapy:  Heparin level 0.3-0.7 units/ml Monitor platelets by anticoagulation protocol: Yes   Plan:  Give 4000 units bolus x 1 Start heparin infusion at 1250 units/hr Check anti-Xa level in 6 hours and daily while on heparin Continue to monitor H&H and platelets F/u with CBC  Joetta Manners, PharmD, [facility] Emergency Medicine Clinical Pharmacist ED RPh Phone: [place]: [phone]

## 2021-02-03 NOTE — ED Notes (Signed)
Cardiology at bedside.

## 2021-02-03 NOTE — ED Notes (Signed)
EDP made aware of critical Troponin.

## 2021-02-03 NOTE — Progress Notes (Signed)
TR band off.R radial site benign.no hematoma present.Radial pulse palpable.Will continue to monitor.

## 2021-02-03 NOTE — ED Provider Notes (Signed)
[facility] EMERGENCY DEPARTMENT Provider Note   CSN: [id number] Arrival date & time: 02/03/21  1431     History Chief Complaint  Patient presents with   Chest Pain    Steven [name] is a 60 y.o. male.  The history is provided by the patient.  Chest Pain Associated symptoms: abdominal pain and shortness of breath   Associated symptoms: no back pain, no fever, no vomiting and no weakness   Patient presents with chest pain.  Anterior chest.  Has had on and off for the last week.  Shortness of breath.  States he has been unable to do the normal work that he can do because of chest pain and feeling weak.  States he saw his PCP who drew some blood work.  Ordered an ultrasound also thought it could be heart.  Started him on nitroglycerin.  Took nitroglycerin today without relief.  First pill helped some second 1 did not really help.  Continued pain.  Also has had some diarrhea.  No fevers.    Past Medical History:  Diagnosis Date   Chest pain    a. Cath 2007:  luminal irregs, normal LVF (Dr. Doylene Canard) - was placed on plavix afterwards.;   b.  ETT-MV 11/13:   mild inferior ischemia, EF 59%.   CML (chronic myeloid leukemia) ([place])    a. Dx 2011 - followed @ [facility] - remission   Hepatitis    IN THE 1980'S   Hyperlipidemia    a. Dx 2006   Hypertension    a. Dx 2006   Pneumonia    hx of PNA    Patient Active Problem List   Diagnosis Date Noted   Hyperlipidemia 02/16/2012   Essential hypertension 02/16/2012   CML (chronic myelocytic leukemia) ([place]) 02/16/2012   CAD (coronary artery disease), native coronary artery 02/16/2012   Substernal chest pain 01/30/2012    Past Surgical History:  Procedure Laterality Date   CARDIAC CATHETERIZATION  02/16/2012   30% tubular prox RCA stenosis, normal coronaries otherwise; LVEF 65%, no WMAs   LEFT HEART CATHETERIZATION WITH CORONARY ANGIOGRAM N/A 02/16/2012   Procedure: LEFT HEART CATHETERIZATION WITH  CORONARY ANGIOGRAM;  Surgeon: Josue Hector, MD;  Location: [facility] CATH LAB;  Service: Cardiovascular;  Laterality: N/A;   Left Rotator Cuff Repair     a. approx. 2003       Family History  Problem Relation Age of Onset   Heart attack Father        Alive @ 13 (stenting @ 79)   Other Mother        Alive & well @ 83   Other Brother        Alive @ 103    Social History   Tobacco Use   Smoking status: Former    Packs/day: 1.00    Years: 30.00    Pack years: 30.00    Types: Cigarettes    Quit date: 02/14/2005    Years since quitting: 15.9   Smokeless tobacco: Never   Tobacco comments:    smoked 1-1/12 ppd for better part of 30 years  Substance Use Topics   Alcohol use: Yes    Comment: Rare drink   Drug use: No    Home Medications Prior to Admission medications   Medication Sig Start Date End Date Taking? Authorizing Provider  aspirin 81 MG chewable tablet Chew 1 tablet (81 mg total) by mouth daily. 02/16/12   Arguello, Lyda Perone, PA-C  atenolol (TENORMIN) 25 MG tablet Take 25 mg by mouth daily.    [provider]  dasatinib (SPRYCEL) 50 MG tablet Take 50 mg by mouth daily. 05/10/18   [provider]  hydrochlorothiazide (HYDRODIURIL) 25 MG tablet Take 1 tablet (25 mg total) by mouth daily. 06/11/12   Bensimhon, Shaune Pascal, MD  ondansetron (ZOFRAN) 4 MG tablet Take 1 tablet (4 mg total) by mouth every 6 (six) hours. 06/26/17   Hedges, Dellis Filbert, PA-C  potassium chloride (K-DUR) 10 MEQ tablet Take 2 tablets (20 mEq total) by mouth daily. 05/10/12   Richardson Dopp T, PA-C  pravastatin (PRAVACHOL) 20 MG tablet Take 20 mg by mouth every evening.    [provider]  traMADol (ULTRAM) 50 MG tablet Take 1 tablet (50 mg total) by mouth every 8 (eight) hours as needed. 05/31/16   Alveda Reasons, MD    Allergies    Patient has no known allergies.  Review of Systems   Review of Systems  Constitutional:  Negative for fever.  HENT:  Negative for congestion.    Respiratory:  Positive for shortness of breath.   Cardiovascular:  Positive for chest pain.  Gastrointestinal:  Positive for abdominal pain and diarrhea. Negative for vomiting.  Genitourinary:  Negative for flank pain.  Musculoskeletal:  Negative for back pain.  Skin:  Negative for rash.  Neurological:  Negative for weakness.  Psychiatric/Behavioral:  Negative for confusion.    Physical Exam Updated Vital Signs BP 135/85   Pulse 96   Temp 98.9 F (37.2 C)   Resp 18   Ht 5' 10.5" (1.791 m)   Wt 102.2 kg   SpO2 97%   BMI 31.87 kg/m   Physical Exam Vitals reviewed.  Constitutional:      Comments: Patient appears uncomfortable.  HENT:     Head: Atraumatic.  Cardiovascular:     Rate and Rhythm: Normal rate and regular rhythm.  Pulmonary:     Breath sounds: No wheezing, rhonchi or rales.  Chest:     Chest wall: Tenderness present.     Comments: Some anterior chest tenderness. Abdominal:     Tenderness: There is abdominal tenderness.     Comments: Right upper quadrant tenderness without rebound or guarding.  No hernia palpated.  Musculoskeletal:     Right lower leg: No edema.     Left lower leg: No edema.  Neurological:     Mental Status: He is alert.    ED Results / Procedures / Treatments   Labs (all labs ordered are listed, but only abnormal results are displayed) Labs Reviewed  COMPREHENSIVE METABOLIC PANEL - Abnormal; Notable for the following components:      Result Value   CO2 20 (*)    Glucose, Bld 122 (*)    Creatinine, Ser 1.28 (*)    AST 48 (*)    Total Bilirubin 1.4 (*)    All other components within normal limits  CBC WITH DIFFERENTIAL/PLATELET - Abnormal; Notable for the following components:   Eosinophils Absolute 1.1 (*)    All other components within normal limits  TROPONIN I (HIGH SENSITIVITY) - Abnormal; Notable for the following components:   Troponin I (High Sensitivity) 568 (*)    All other components within normal limits  HEPARIN LEVEL  (UNFRACTIONATED)  LIPASE, BLOOD    EKG EKG Interpretation  Date/Time:  Thursday February 03 2021 15:40:34 EST Ventricular Rate:  87 PR Interval:  159 QRS Duration: 119 QT Interval:  366 QTC Calculation: 441  R Axis:   -24 Text Interpretation: Sinus rhythm Incomplete right bundle branch block Borderline ST depression, lateral leads Improving ST changes but still present. Confirmed by Davonna Belling [phone]) on 02/03/2021 3:46:18 PM  Radiology DG Chest Portable 1 View  Result Date: 02/03/2021 CLINICAL DATA:  Chest pain EXAM: PORTABLE CHEST 1 VIEW COMPARISON:  January 18, 2021 FINDINGS: The heart size and mediastinal contours are stable. Both lungs are clear. The visualized skeletal structures are unremarkable. IMPRESSION: No active disease. Electronically Signed   By: Abelardo Diesel M.D.   On: 02/03/2021 15:28    Procedures Procedures   Medications Ordered in ED Medications  nitroGLYCERIN 50 mg in dextrose 5 % 250 mL (0.2 mg/mL) infusion (15 mcg/min Intravenous Rate/Dose Change 02/03/21 1542)  heparin ADULT infusion 100 units/mL (25000 units/229mL) (1,250 Units/hr Intravenous New Bag/Given 02/03/21 1539)  morphine 4 MG/ML injection 4 mg (4 mg Intravenous Given 02/03/21 1507)  ondansetron (ZOFRAN) injection 4 mg (4 mg Intravenous Given 02/03/21 1505)  heparin bolus via infusion 4,000 Units (4,000 Units Intravenous Bolus from Bag 02/03/21 1541)  aspirin chewable tablet 324 mg (324 mg Oral Given 02/03/21 1538)    ED Course  I have reviewed the triage vital signs and the nursing notes.  Pertinent labs & imaging results that were available during my care of the patient were reviewed by me and considered in my medical decision making (see chart for details).    MDM Rules/Calculators/A&P                          Patient with anterior chest pain.  Has had on and off for the last week but worse since 1:00 today.  Appears uncomfortable.  Reviewed outpatient blood work with patient  hand.  AST just barely above normal but otherwise labs reassuring.  Creatinine slightly increased.  Did not do any troponins then though.  Patient states his PCP thought his EKG was abnormal.  Initial EKG showed ST elevation in aVR and some depression in 2 and V3 through V6.  Discussed with Dr. Angelena Form the on-call STEMI doctor.  Does not feel this needs emergent Cath Lab activation at this point.  We will try more medical management with heparin nitroglycerin morphine.  We will try and get his pain under control.  If pain not able to get under control potentially would require Cath Lab.  Pain still present but may be a little better than before.  Troponin came back elevated at 500.  Has had heparin nitroglycerin and aspirin.  EKG repeated and has some improving ST changes along with the ST elevation decreasing.  Will discuss with cardiology. Final Clinical Impression(s) / ED Diagnoses Final diagnoses:  NSTEMI (non-ST elevated myocardial infarction) [facility])    Rx / [place] Orders ED Discharge Orders     None        Davonna Belling, MD 02/03/21 1555

## 2021-02-04 ENCOUNTER — Encounter (HOSPITAL_COMMUNITY): Payer: Self-pay | Admitting: Cardiovascular Disease

## 2021-02-04 ENCOUNTER — Other Ambulatory Visit (HOSPITAL_COMMUNITY): Payer: Self-pay

## 2021-02-04 ENCOUNTER — Inpatient Hospital Stay (HOSPITAL_COMMUNITY): Payer: BC Managed Care – PPO

## 2021-02-04 DIAGNOSIS — E7800 Pure hypercholesterolemia, unspecified: Secondary | ICD-10-CM

## 2021-02-04 DIAGNOSIS — I2510 Atherosclerotic heart disease of native coronary artery without angina pectoris: Secondary | ICD-10-CM

## 2021-02-04 LAB — BASIC METABOLIC PANEL
Anion gap: 7 (ref 5–15)
BUN: 15 mg/dL (ref 8–23)
CO2: 22 mmol/L (ref 22–32)
Calcium: 9.2 mg/dL (ref 8.9–10.3)
Chloride: 110 mmol/L (ref 98–111)
Creatinine, Ser: 1.22 mg/dL (ref 0.61–1.24)
GFR, Estimated: >60 mL/min (ref >60)
Glucose, Bld: 91 mg/dL (ref 70–99)
Potassium: 4.7 mmol/L (ref 3.5–5.1)
Sodium: 139 mmol/L (ref 135–145)

## 2021-02-04 LAB — ECHOCARDIOGRAM COMPLETE
AR max vel: 3.04 cm2
AV Area VTI: 3.04 cm2
AV Area mean vel: 2.86 cm2
AV Mean grad: 5 mmHg
AV Peak grad: 9.7 mmHg
Ao pk vel: 1.56 m/s
Area-P 1/2: 3.85 cm2
Height: 70.5 in
S' Lateral: 2.8 cm
Single Plane A4C EF: 62 %
Weight: 3393.32 oz

## 2021-02-04 LAB — CBC
HCT: 42.1 % (ref 39.0–52.0)
Hemoglobin: 14.3 g/dL (ref 13.0–17.0)
MCH: 29.9 pg (ref 26.0–34.0)
MCHC: 34 g/dL (ref 30.0–36.0)
MCV: 87.9 fL (ref 80.0–100.0)
Platelets: 228 10*3/uL (ref 150–400)
RBC: 4.79 MIL/uL (ref 4.22–5.81)
RDW: 14.2 % (ref 11.5–15.5)
WBC: 7.8 10*3/uL (ref 4.0–10.5)
nRBC: 0 % (ref 0.0–0.2)

## 2021-02-04 LAB — LIPID PANEL
Cholesterol: 132 mg/dL (ref 0–200)
HDL: 32 mg/dL — ABNORMAL LOW (ref >40)
LDL Cholesterol: 72 mg/dL (ref 0–99)
Total CHOL/HDL Ratio: 4.1 RATIO
Triglycerides: 140 mg/dL (ref <150)
VLDL: 28 mg/dL (ref 0–40)

## 2021-02-04 LAB — TROPONIN I (HIGH SENSITIVITY): Troponin I (High Sensitivity): 1097 ng/L (ref <18)

## 2021-02-04 LAB — HIV ANTIBODY (ROUTINE TESTING W REFLEX): HIV Screen 4th Generation wRfx: NONREACTIVE

## 2021-02-04 MED ORDER — LISINOPRIL 5 MG PO TABS
5.0000 mg | ORAL_TABLET | Freq: Every day | ORAL | Status: DC
  Administered 2021-02-04 – 2021-02-05 (×2): 5 mg via ORAL
  Filled 2021-02-04 (×2): qty 1

## 2021-02-04 MED ORDER — CHLORHEXIDINE GLUCONATE CLOTH 2 % EX PADS
6.0000 | MEDICATED_PAD | Freq: Every day | CUTANEOUS | Status: DC
  Administered 2021-02-04: 6 via TOPICAL

## 2021-02-04 MED ORDER — COLCHICINE 0.6 MG PO TABS
0.6000 mg | ORAL_TABLET | Freq: Two times a day (BID) | ORAL | Status: DC
  Administered 2021-02-04 (×2): 0.6 mg via ORAL
  Filled 2021-02-04 (×3): qty 1

## 2021-02-04 MED FILL — Heparin Sod (Porcine)-NaCl IV Soln 1000 Unit/500ML-0.9%: INTRAVENOUS | Qty: 1000 | Status: AC

## 2021-02-04 NOTE — Progress Notes (Signed)
Progress Note  Patient Name: Steven [name] Date of Encounter: 02/04/2021  [facility] Cardiologist: Evalina Field, MD   Subjective   Chest pain with deep breaths only. No dyspnea.   Inpatient Medications    Scheduled Meds:  aspirin EC  81 mg Oral Daily   atorvastatin  80 mg Oral Daily   Chlorhexidine Gluconate Cloth  6 each Topical Daily   dasatinib  50 mg Oral Daily   metoprolol tartrate  25 mg Oral BID   sodium chloride flush  3 mL Intravenous Q12H   sodium chloride flush  3 mL Intravenous Q12H   ticagrelor  90 mg Oral BID   Continuous Infusions:  sodium chloride     sodium chloride     nitroGLYCERIN Stopped (02/04/21 0047)   PRN Meds: sodium chloride, sodium chloride, acetaminophen, nitroGLYCERIN, ondansetron (ZOFRAN) IV, sodium chloride flush, sodium chloride flush   Vital Signs    Vitals:   02/04/21 0300 02/04/21 0400 02/04/21 0500 02/04/21 0600  BP: 113/78 125/77 121/72 (!) 137/95  Pulse: (!) 58 (!) 57 60 72  Resp: 15 16 14 14   Temp: 97.9 F (36.6 C)     TempSrc: Oral     SpO2: 97% 99% 95% 98%  Weight:    96.2 kg  Height:        Intake/Output Summary (Last 24 hours) at 02/04/2021 0711 Last data filed at 02/04/2021 0600 Gross per 24 hour  Intake 903.48 ml  Output 500 ml  Net 403.48 ml   Last 3 Weights 02/04/2021 02/03/2021 10/07/2020  Weight (lbs) 212 lb 1.3 oz 225 lb 5 oz 225 lb 3.2 oz  Weight (kg) 96.2 kg 102.2 kg 102.15 kg      Telemetry    sinus - Personally Reviewed  ECG    Sinus bradycardia - Personally Reviewed  Physical Exam   GEN: No acute distress.   Neck: No JVD Cardiac: RRR, no murmurs, rubs, or gallops.  Respiratory: Clear to auscultation bilaterally. GI: Soft, nontender, non-distended  MS: No edema; No deformity. Neuro:  Nonfocal  Psych: Normal affect   Labs    High Sensitivity Troponin:   Recent Labs  Lab 02/03/21 1449 02/03/21 2106 02/04/21 0127  TROPONINIHS 568* 984* 1,097*     Chemistry Recent  Labs  Lab 02/03/21 1449 02/04/21 0127  NA 136 139  K 4.0 4.7  CL 105 110  CO2 20* 22  GLUCOSE 122* 91  BUN 17 15  CREATININE 1.28* 1.22  CALCIUM 9.6 9.2  PROT 7.2  --   ALBUMIN 4.3  --   AST 48*  --   ALT 34  --   ALKPHOS 64  --   BILITOT 1.4*  --   GFRNONAA >60 >60  ANIONGAP 11 7    Lipids  Recent Labs  Lab 02/04/21 0127  CHOL 132  TRIG 140  HDL 32*  LDLCALC 72  CHOLHDL 4.1    Hematology Recent Labs  Lab 02/03/21 1449 02/04/21 0127  WBC 8.1 7.8  RBC 5.49 4.79  HGB 16.8 14.3  HCT 47.0 42.1  MCV 85.6 87.9  MCH 30.6 29.9  MCHC 35.7 34.0  RDW 13.9 14.2  PLT 283 228   Thyroid  Recent Labs  Lab 02/03/21 2106  TSH 1.631    BNP Recent Labs  Lab 02/03/21 2106  BNP 33.2    DDimer No results for input(s): DDIMER in the last 168 hours.   Radiology    CARDIAC CATHETERIZATION  Result Date:  02/03/2021   Prox RCA to Mid RCA lesion is 30% stenosed.   Dist RCA lesion is 30% stenosed.   Prox Cx to Mid Cx lesion is 99% stenosed.   Mid LAD lesion is 90% stenosed.   A drug-eluting stent was successfully placed using a SYNERGY XD 3.0X16.   A drug-eluting stent was successfully placed using a SYNERGY XD 3.0X16.   Post intervention, there is a 0% residual stenosis.   Post intervention, there is a 0% residual stenosis. NSTEMI secondary to sub-total mid Circumflex occlusion. Successful PTCA/DES x 1 mid Circumflex Severe mid LAD stenosis Successful PTCA/DES x 1 mid LAD Mild non-obstructive disease in the mid and distal RCA Normal LVEDP Recommendations: Will admit to the ICU. Echo in am. Aggrastat for 2 hours. Continue DAPT with ASA and Brilinta for 12 months. High intensity statin and beta blocker.   DG Chest Portable 1 View  Result Date: 02/03/2021 CLINICAL DATA:  Chest pain EXAM: PORTABLE CHEST 1 VIEW COMPARISON:  January 18, 2021 FINDINGS: The heart size and mediastinal contours are stable. Both lungs are clear. The visualized skeletal structures are unremarkable.  IMPRESSION: No active disease. Electronically Signed   By: Abelardo Diesel M.D.   On: 02/03/2021 15:28    Cardiac Studies   See above  Patient Profile     60 y.o. male with CML, HTN, HLD and former tobacco abuse admitted with a NSTEMI on 02/03/21. Cardiac with severe mid Circumflex stenosis and severe mid LAD stenosis.   Assessment & Plan    NSTEMI: He is doing well this am. He is having mild chest pain with deep breaths which may represent Dressler's syndrome. EKG is normal. Cardiac 02/03/21 with severe mid Circumflex stenosis and severe mid LAD stenosis. Drug eluting stents were placed in both the Circumflex and the LAD. Mild disease in the RCA. Will plan to continue DAPT with ASA/Brilinta for one year. Will continue high intensity statin. Continue beta blocker. Add colchicine for post MI pain. Echo today. Possible d/c home later this afternoon if he is stable.  2. HTN: Will add Lisinopril 5 mg daily 3. HLD: Continue high intensity statin  For questions or updates, please contact [place] Please consult [email or web address] for contact info under        Signed, Lauree Chandler, MD  02/04/2021, 7:11 AM

## 2021-02-04 NOTE — Discharge Instructions (Addendum)
Information about your medication: Brilinta (anti-platelet agent)  Generic Name (Brand): ticagrelor (Brilinta), twice daily medication  PURPOSE: You are taking this medication along with aspirin to lower your chance of having a heart attack, stroke, or blood clots in your heart stent. These can be fatal. Brilinta and aspirin help prevent platelets from sticking together and forming a clot that can block an artery or your stent.   Common SIDE EFFECTS you may experience include: bruising or bleeding more easily, shortness of breath  Do not stop taking BRILINTA without talking to the doctor who prescribes it for you. People who are treated with a stent and stop taking Brilinta too soon, have a higher risk of getting a blood clot in the stent, having a heart attack, or dying. If you stop Brilinta because of bleeding, or for other reasons, your risk of a heart attack or stroke may increase.   Avoid taking NSAID agents or anti-inflammatory medications such as ibuprofen, naproxen given increased bleed risk with plavix - can use acetaminophen (Tylenol) if needed for pain.  Tell all of your doctors and dentists that you are taking Brilinta. They should talk to the doctor who prescribed Brilinta for you before you have any surgery or invasive procedure.   Contact your health care provider if you experience: severe or uncontrollable bleeding, pink/red/brown urine, vomiting blood or vomit that looks like "coffee grounds", red or black stools (looks like tar), coughing up blood or blood clots ----------------------------------------------------------------------------------------------------------------------  **PLEASE REMEMBER TO BRING ALL OF YOUR MEDICATIONS TO EACH OF YOUR FOLLOW-UP OFFICE VISITS.  NO HEAVY LIFTING X 4 WEEKS. NO SEXUAL ACTIVITY X 4 WEEKS. NO DRIVING X 2 WEEKS. NO SOAKING BATHS, HOT TUBS, POOLS, ETC., X 7 DAYS.  Radial Site Care Refer to this sheet in the next few weeks. These  instructions provide you with information on caring for yourself after your procedure. Your caregiver may also give you more specific instructions. Your treatment has been planned according to current medical practices, but problems sometimes occur. Call your caregiver if you have any problems or questions after your procedure. HOME CARE INSTRUCTIONS You may shower the day after the procedure. Remove the bandage (dressing) and gently wash the site with plain soap and water. Gently pat the site dry.  Do not apply powder or lotion to the site.  Do not submerge the affected site in water for 3 to 5 days.  Inspect the site at least twice daily.  Do not flex or bend the affected arm for 24 hours.  No lifting over 5 pounds (2.3 kg) for 5 days after your procedure.  Do not drive home if you are discharged the same day of the procedure. Have someone else drive you.   What to expect: Any bruising will usually fade within 1 to 2 weeks.  Blood that collects in the tissue (hematoma) may be painful to the touch. It should usually decrease in size and tenderness within 1 to 2 weeks.  SEEK IMMEDIATE MEDICAL CARE IF: You have unusual pain at the radial site.  You have redness, warmth, swelling, or pain at the radial site.  You have drainage (other than a small amount of blood on the dressing).  You have chills.  You have a fever or persistent symptoms for more than 72 hours.  You have a fever and your symptoms suddenly get worse.  Your arm becomes pale, cool, tingly, or numb.  You have heavy bleeding from the site. Hold pressure on the site.  _____________    10 Habits of Highly Healthy People  [place] wants to help you get well and stay well.  Live a longer, healthier life by practicing healthy habits every day.  1.  Visit your primary care provider regularly. 2.  Make time for family and friends.  Healthy relationships are important. 3.  Take medications as directed by your provider. 4.   Maintain a healthy weight and a trim waistline. 5.  Eat healthy meals and snacks, rich in fruits, vegetables, whole grains, and lean proteins. 6.  Get moving every day - aim for 150 minutes of moderate physical activity each week. 7.  Don't smoke. 8.  Avoid alcohol or drink in moderation. 9.  Manage stress through meditation or mindful relaxation. 10.  Get seven to nine hours of quality sleep each night.  Want more information on healthy habits?  To learn more about these and other healthy habits, visit SecuritiesCard.it. _____________    Dennis Bast have received care from [facility] during this hospital stay and we look forward to continuing to provide you with excellent care in our office settings after you've left the hospital.  In order to assure a smoother transition to home following your discharge from the hospital, we will likely have you see one of our nurse practitioners or physician assistants within a few weeks of discharge.  Our advanced practice providers work closely with your physician in order to address all of your heart's needs in a timely manner.  More information about all of our providers may be found here: [email or web address]  Please plan to bring all of your prescriptions to your follow-up appointment and don't hesitate to contact us with questions or concerns.  [facility] [place] - [phone] - [place] - [place] - [phone] [facility] [place] - [phone] [facility] [place] [phone] [place] [phone]

## 2021-02-04 NOTE — Progress Notes (Signed)
  Echocardiogram 2D Echocardiogram has been performed.  Steven [name] F 02/04/2021, 8:48 AM

## 2021-02-04 NOTE — Progress Notes (Addendum)
CARDIAC REHAB PHASE I   [phone] Patient remains on bedrest at this time. CR in to educate. MI booklet reviewed along with risk factors, exercise guidelines, restrictions, nutrition, DAPT, and nitroglycerine. Stent card given. Patient agreeable to phase 2 cardiac rehab referral to [facility]. Encouraged to ambulate in hallway when able. Will follow up with patient in the am for ambulation.   Steven [name] Minus Breeding RN, BSN

## 2021-02-04 NOTE — Progress Notes (Signed)
0700 - Report received from PM RN.  All questions answered.  Safety checks performed.  All lines verified and R radial site evaluated. Hand hygiene performed before/after each pt contact. 64- Dr. Angelena Form rounded on pt. 0815 - ECHO performed. 0856 - Cardiac Rehab rounded on pt. 1100 - Transfer orders placed. 1130 - Pt ambulated full lap around the unit. 1300 - Pt bathed.  Gown and linen changed. 1358 - Report called to April, [place] RN.  All questions answered. 1500 - Pt elected to ambulate to 2C.  Pt able to ambulate without difficulty or distress.  Pt placed in 2C01.  April, RN aware.  Chart transported with pt.

## 2021-02-05 ENCOUNTER — Encounter (HOSPITAL_COMMUNITY): Payer: Self-pay | Admitting: Cardiovascular Disease

## 2021-02-05 DIAGNOSIS — I241 Dressler's syndrome: Secondary | ICD-10-CM

## 2021-02-05 DIAGNOSIS — N182 Chronic kidney disease, stage 2 (mild): Secondary | ICD-10-CM

## 2021-02-05 LAB — BASIC METABOLIC PANEL
Anion gap: 6 (ref 5–15)
BUN: 17 mg/dL (ref 8–23)
CO2: 24 mmol/L (ref 22–32)
Calcium: 9.3 mg/dL (ref 8.9–10.3)
Chloride: 106 mmol/L (ref 98–111)
Creatinine, Ser: 1.21 mg/dL (ref 0.61–1.24)
GFR, Estimated: >60 mL/min (ref >60)
Glucose, Bld: 102 mg/dL — ABNORMAL HIGH (ref 70–99)
Potassium: 4.1 mmol/L (ref 3.5–5.1)
Sodium: 136 mmol/L (ref 135–145)

## 2021-02-05 LAB — CBC
HCT: 42.2 % (ref 39.0–52.0)
Hemoglobin: 14.4 g/dL (ref 13.0–17.0)
MCH: 29.9 pg (ref 26.0–34.0)
MCHC: 34.1 g/dL (ref 30.0–36.0)
MCV: 87.7 fL (ref 80.0–100.0)
Platelets: 228 10*3/uL (ref 150–400)
RBC: 4.81 MIL/uL (ref 4.22–5.81)
RDW: 14.4 % (ref 11.5–15.5)
WBC: 7.8 10*3/uL (ref 4.0–10.5)
nRBC: 0 % (ref 0.0–0.2)

## 2021-02-05 MED ORDER — TICAGRELOR 90 MG PO TABS: 90.0000 mg | ORAL_TABLET | Freq: Two times a day (BID) | ORAL | 6 refills | Status: DC

## 2021-02-05 MED ORDER — NITROGLYCERIN 0.4 MG SL SUBL: 0.4000 mg | SUBLINGUAL_TABLET | SUBLINGUAL | 3 refills | Status: DC | PRN

## 2021-02-05 MED ORDER — LISINOPRIL 5 MG PO TABS: 5.0000 mg | ORAL_TABLET | Freq: Every day | ORAL | 6 refills | Status: DC

## 2021-02-05 MED ORDER — ATORVASTATIN CALCIUM 80 MG PO TABS: 80.0000 mg | ORAL_TABLET | Freq: Every day | ORAL | 6 refills | Status: DC

## 2021-02-05 MED ORDER — COLCHICINE 0.3 MG HALF TABLET
0.3000 mg | ORAL_TABLET | Freq: Two times a day (BID) | ORAL | Status: DC
  Administered 2021-02-05: 0.3 mg via ORAL
  Filled 2021-02-05: qty 1

## 2021-02-05 MED ORDER — COLCHICINE 0.6 MG PO TABS: 0.3000 mg | ORAL_TABLET | Freq: Two times a day (BID) | ORAL | 1 refills | Status: DC

## 2021-02-05 MED ORDER — METOPROLOL TARTRATE 25 MG PO TABS: 25.0000 mg | ORAL_TABLET | Freq: Two times a day (BID) | ORAL | 6 refills | Status: DC

## 2021-02-05 NOTE — Discharge Summary (Addendum)
Discharge Summary    Patient ID: Steven [name] MRN: [id number]; DOB: 02-03-1961  Admit date: 02/03/2021 Discharge date: 02/05/2021  PCP:  Enid Skeens., MD   [facility] Providers Cardiologist:  Evalina Field, MD        Discharge Diagnoses    Principal Problem:   NSTEMI (non-ST elevated myocardial infarction) [facility])  **Status post PCI and drug-eluting stent placement to the LAD and left circumflex this admission.  Active Problems:   CAD (coronary artery disease), native coronary artery   Unstable angina (HCC)   Dressler's syndrome (HCC)   Essential hypertension   Hyperlipidemia   CML (chronic myelocytic leukemia) ([place])   CKD (chronic kidney disease), stage II   Diagnostic Studies/Procedures    Cardiac Catheterization and Percutaneous Coronary Intervention 11.24.2022  Left Anterior Descending  Vessel is large.  Mid LAD lesion is 90% stenosed.      **The mid LAD was successfully treated with a 3.0 x 16 mm Synergy XD drug-eluting stent**  Left Circumflex  Vessel is large.  Prox Cx to Mid Cx lesion is 99% stenosed.      **The proximal to mid left circumflex was successfully treated with a 3.0 x 16 mm Synergy XD drug-eluting stent**  Right Coronary Artery  Prox RCA to Mid RCA lesion is 30% stenosed.  Dist RCA lesion is 30% stenosed.  _____________  2D Echocardiogram 11.25.2022  1. Left ventricular ejection fraction, by estimation, is 55 to 60%. The  left ventricle has normal function. The left ventricle has no regional  wall motion abnormalities. Left ventricular diastolic parameters were  normal.   2. Right ventricular systolic function is normal. The right ventricular  size is normal. There is normal pulmonary artery systolic pressure.   3. Left atrial size was moderately dilated.   4. The mitral valve is abnormal. Mild mitral valve regurgitation. No  evidence of mitral stenosis.   5. The aortic valve is tricuspid. There is mild calcification of  the  aortic valve. Aortic valve regurgitation is not visualized. Aortic valve  sclerosis is present, with no evidence of aortic valve stenosis.   6. The inferior vena cava is normal in size with greater than 50%  respiratory variability, suggesting right atrial pressure of 3 mmHg.  _____________    History of Present Illness     Steven [name] is a 60 y.o. male with history of CML, hypertension, hyperlipidemia, and prior tobacco abuse.  He also has a strong family history of premature CAD.  Beginning approximately 2 weeks prior to admission, he began to experience exertional substernal chest discomfort that improved with rest.  On the evening of November 19, he had an episode of chest pain that awoke him from sleep, persisted for 2 hours, and was associated with dyspnea, nausea, and vomiting.  Chest pain eventually eased off though he noted intermittent recurrent chest pain over the subsequent days.  On November 24, he had recurrent 10/10 chest pain associate with nausea, vomiting, dyspnea, and diaphoresis, and his brother drove him to the [facility] emergency department.  On arrival, ECG showed ST segment elevation in aVR with ST depression in inferior and precordial leads.  Initial troponin was 568.  Bedside echo demonstrated normal LV and RV function without regional wall motion abnormalities.  He was placed on intravenous nitroglycerin with ongoing chest pain and decision was made to pursue urgent diagnostic catheterization.  Hospital Course     Consultants: None  Patient underwent diagnostic catheterization on November 24, revealing  a 90% stenosis in the mid LAD, 99% stenosis in the proximal to mid left circumflex, and 30% stenoses in the proximal and distal RCA.  He underwent successful PCI and drug-eluting stent placement to the mid LAD and proximal/mid left circumflex with 3.0 x 16 mm Synergy XD drug-eluting stents placed in each location.  He was subsequently monitored in cardiac  progressive care where troponin peaked at 1097.  Complete echocardiogram performed November 25, showed normal LV function without regional wall motion abnormalities, mild MR, and aortic sclerosis without stenosis.  Post PCI, patient has been experiencing pleuritic chest pain.  Out of concern for Dressler syndrome, he was placed on colchicine 0.6 mg twice daily.  This did improve pleuritic chest pain some however, he has experienced diarrhea.  His colchicine dose has been reduced to 0.3 mg twice daily with recommendation to continue dosing until chest pain resolves or least until office follow-up.  [name] will be discharged home today in stable condition on dual antiplatelet therapy, beta-blocker, ACE inhibitor, high potency statin, and low-dose colchicine therapy.  We will arrange for follow-up in our office within the next 7 to 14 days.  Did the patient have an acute coronary syndrome (MI, NSTEMI, STEMI, etc) this admission?:  Yes                               AHA/ACC Clinical Performance & Quality Measures: Aspirin prescribed? - Yes ADP Receptor Inhibitor (Plavix/Clopidogrel, Brilinta/Ticagrelor or Effient/Prasugrel) prescribed (includes medically managed patients)? - Yes Beta Blocker prescribed? - Yes High Intensity Statin (Lipitor 40-80mg  or Crestor 20-40mg ) prescribed? - Yes EF assessed during THIS hospitalization? - Yes For EF <40%, was ACEI/ARB prescribed? - Not Applicable (EF >/= 75%) For EF <40%, Aldosterone Antagonist (Spironolactone or Eplerenone) prescribed? - Not Applicable (EF >/= 10%) Cardiac Rehab Phase II ordered (including medically managed patients)? - Yes    The patient will be scheduled for a TOC follow up appointment in 7 days.  A message has been sent to the Operating Room Services and Scheduling Pool at the office where the patient should be seen for follow up.  _____________  Discharge Vitals Blood pressure 122/69, pulse 73, temperature 98.5 F (36.9 C), resp. rate 19, height 5'  10.5" (1.791 m), weight 96 kg, SpO2 96 %.  Filed Weights   02/03/21 1500 02/04/21 0600 02/05/21 0518  Weight: 102.2 kg 96.2 kg 96 kg    Labs & Radiologic Studies    CBC Recent Labs    02/03/21 1449 02/04/21 0127 02/05/21 0127  WBC 8.1 7.8 7.8  NEUTROABS 3.9  --   --   HGB 16.8 14.3 14.4  HCT 47.0 42.1 42.2  MCV 85.6 87.9 87.7  PLT 283 228 258   Basic Metabolic Panel Recent Labs    02/04/21 0127 02/05/21 0127  NA 139 136  K 4.7 4.1  CL 110 106  CO2 22 24  GLUCOSE 91 102*  BUN 15 17  CREATININE 1.22 1.21  CALCIUM 9.2 9.3   Liver Function Tests Recent Labs    02/03/21 1449  AST 48*  ALT 34  ALKPHOS 64  BILITOT 1.4*  PROT 7.2  ALBUMIN 4.3   Recent Labs    02/03/21 1643  LIPASE 41   High Sensitivity Troponin:   Recent Labs  Lab 02/03/21 1449 02/03/21 2106 02/04/21 0127  TROPONINIHS 568* 984* 1,097*    Hemoglobin A1C Recent Labs    02/03/21 2106  HGBA1C 4.6*   Fasting Lipid Panel Recent Labs    02/04/21 0127  CHOL 132  HDL 32*  LDLCALC 72  TRIG 140  CHOLHDL 4.1   Thyroid Function Tests Recent Labs    02/03/21 2106  TSH 1.631   _____________   DG Chest Portable 1 View  Result Date: 02/03/2021 CLINICAL DATA:  Chest pain EXAM: PORTABLE CHEST 1 VIEW COMPARISON:  January 18, 2021 FINDINGS: The heart size and mediastinal contours are stable. Both lungs are clear. The visualized skeletal structures are unremarkable. IMPRESSION: No active disease. Electronically Signed   By: Abelardo Diesel M.D.   On: 02/03/2021 15:28   Disposition   Pt is being discharged home today in good condition.  Follow-up Plans & Appointments     Follow-up Information     O'Neal, Cassie Freer, MD Follow up.   Specialties: Cardiology, Internal Medicine, Radiology Contact information: [address] [place] [address] [phone]                Discharge Instructions     AMB Referral to Phase II Cardiac Rehab   Complete by: As directed     Diagnosis:  STEMI Coronary Stents     After initial evaluation and assessments completed: Virtual Based Care may be provided alone or in conjunction with Phase 2 Cardiac Rehab based on patient barriers.: Yes   Amb Referral to Cardiac Rehabilitation   Complete by: As directed    Diagnosis:  NSTEMI Coronary Stents     After initial evaluation and assessments completed: Virtual Based Care may be provided alone or in conjunction with Phase 2 Cardiac Rehab based on patient barriers.: Yes   Call MD for:  difficulty breathing, headache or visual disturbances   Complete by: As directed    Call MD for:  extreme fatigue   Complete by: As directed    Call MD for:  redness, tenderness, or signs of infection (pain, swelling, redness, odor or green/yellow discharge around incision site)   Complete by: As directed    Call MD for:  severe uncontrolled pain   Complete by: As directed    Call MD for:  temperature >100.4   Complete by: As directed    Diet - low sodium heart healthy   Complete by: As directed    Increase activity slowly   Complete by: As directed        Discharge Medications   Allergies as of 02/05/2021   No Known Allergies      Medication List     STOP taking these medications    atenolol 25 MG tablet Commonly known as: TENORMIN   hydrochlorothiazide 25 MG tablet Commonly known as: HYDRODIURIL   omeprazole 40 MG capsule Commonly known as: PRILOSEC   pravastatin 20 MG tablet Commonly known as: PRAVACHOL       TAKE these medications    aspirin 81 MG chewable tablet Chew 1 tablet (81 mg total) by mouth daily.   atorvastatin 80 MG tablet Commonly known as: LIPITOR Take 1 tablet (80 mg total) by mouth daily. Start taking on: February 06, 2021   colchicine 0.6 MG tablet Take 0.5 tablets (0.3 mg total) by mouth 2 (two) times daily.   dasatinib 50 MG tablet Commonly known as: SPRYCEL Take 50 mg by mouth every morning.   lisinopril 5 MG tablet Commonly  known as: ZESTRIL Take 1 tablet (5 mg total) by mouth daily. Start taking on: February 06, 2021   metoprolol tartrate 25 MG tablet  Commonly known as: LOPRESSOR Take 1 tablet (25 mg total) by mouth 2 (two) times daily.   nitroGLYCERIN 0.4 MG SL tablet Commonly known as: NITROSTAT Place 1 tablet (0.4 mg total) under the tongue every 5 (five) minutes as needed for chest pain.   ticagrelor 90 MG Tabs tablet Commonly known as: BRILINTA Take 1 tablet (90 mg total) by mouth 2 (two) times daily.         Outstanding Labs/Studies   F/u lipids/lfts in 6-8 wks (escalation of statin rx this admission)  Duration of Discharge Encounter   Greater than 30 minutes including physician time.  Signed, Murray Hodgkins, NP 02/05/2021, 10:28 AM

## 2021-02-05 NOTE — Progress Notes (Signed)
Progress Note  Patient Name: Steven [name] Date of Encounter: 02/05/2021  Primary Cardiologist: Evalina Field, MD   Subjective   Feeling better. Can't take a full deep breath due to discomfort. On colchicine, having diarrhea. Ready to go home. Works as an Clinical biochemist.  Inpatient Medications    Scheduled Meds:  aspirin EC  81 mg Oral Daily   atorvastatin  80 mg Oral Daily   Chlorhexidine Gluconate Cloth  6 each Topical Daily   colchicine  0.6 mg Oral BID   dasatinib  50 mg Oral Daily   lisinopril  5 mg Oral Daily   metoprolol tartrate  25 mg Oral BID   sodium chloride flush  3 mL Intravenous Q12H   ticagrelor  90 mg Oral BID   Continuous Infusions:  sodium chloride     PRN Meds: sodium chloride, acetaminophen, nitroGLYCERIN, ondansetron (ZOFRAN) IV, sodium chloride flush   Vital Signs    Vitals:   02/04/21 2354 02/05/21 0307 02/05/21 0518 02/05/21 0737  BP: 109/68 115/67  122/69  Pulse: 73     Resp: 20 18  19   Temp: 97.6 F (36.4 C) 98.3 F (36.8 C)  98.5 F (36.9 C)  TempSrc: Oral Oral    SpO2: 98% 96%    Weight:   96 kg   Height:        Intake/Output Summary (Last 24 hours) at 02/05/2021 0916 Last data filed at 02/05/2021 0300 Gross per 24 hour  Intake 603 ml  Output 550 ml  Net 53 ml   Filed Weights   02/03/21 1500 02/04/21 0600 02/05/21 0518  Weight: 102.2 kg 96.2 kg 96 kg    Telemetry    SR - Personally Reviewed  ECG    NSR, nonspec ST abnol - Personally Reviewed  Physical Exam   GEN: No acute distress.   Neck: No JVD Cardiac: regular rhythm, normal rate, no murmurs, rubs, or gallops. R radial site c/d/I, 4/4 pulse.  Respiratory: Clear to auscultation bilaterally. GI: Soft, nontender, non-distended  MS: No edema; No deformity. Neuro:  Nonfocal  Psych: Normal affect   Labs    Chemistry Recent Labs  Lab 02/03/21 1449 02/04/21 0127 02/05/21 0127  NA 136 139 136  K 4.0 4.7 4.1  CL 105 110 106  CO2 20* 22 24  GLUCOSE  122* 91 102*  BUN 17 15 17   CREATININE 1.28* 1.22 1.21  CALCIUM 9.6 9.2 9.3  PROT 7.2  --   --   ALBUMIN 4.3  --   --   AST 48*  --   --   ALT 34  --   --   ALKPHOS 64  --   --   BILITOT 1.4*  --   --   GFRNONAA >60 >60 >60  ANIONGAP 11 7 6      Hematology Recent Labs  Lab 02/03/21 1449 02/04/21 0127 02/05/21 0127  WBC 8.1 7.8 7.8  RBC 5.49 4.79 4.81  HGB 16.8 14.3 14.4  HCT 47.0 42.1 42.2  MCV 85.6 87.9 87.7  MCH 30.6 29.9 29.9  MCHC 35.7 34.0 34.1  RDW 13.9 14.2 14.4  PLT 283 228 228    Cardiac EnzymesNo results for input(s): TROPONINI in the last 168 hours. No results for input(s): TROPIPOC in the last 168 hours.   BNP Recent Labs  Lab 02/03/21 2106  BNP 33.2     DDimer No results for input(s): DDIMER in the last 168 hours.   Radiology  CARDIAC CATHETERIZATION  Result Date: 02/03/2021   Prox RCA to Mid RCA lesion is 30% stenosed.   Dist RCA lesion is 30% stenosed.   Prox Cx to Mid Cx lesion is 99% stenosed.   Mid LAD lesion is 90% stenosed.   A drug-eluting stent was successfully placed using a SYNERGY XD 3.0X16.   A drug-eluting stent was successfully placed using a SYNERGY XD 3.0X16.   Post intervention, there is a 0% residual stenosis.   Post intervention, there is a 0% residual stenosis. NSTEMI secondary to sub-total mid Circumflex occlusion. Successful PTCA/DES x 1 mid Circumflex Severe mid LAD stenosis Successful PTCA/DES x 1 mid LAD Mild non-obstructive disease in the mid and distal RCA Normal LVEDP Recommendations: Will admit to the ICU. Echo in am. Aggrastat for 2 hours. Continue DAPT with ASA and Brilinta for 12 months. High intensity statin and beta blocker.   DG Chest Portable 1 View  Result Date: 02/03/2021 CLINICAL DATA:  Chest pain EXAM: PORTABLE CHEST 1 VIEW COMPARISON:  January 18, 2021 FINDINGS: The heart size and mediastinal contours are stable. Both lungs are clear. The visualized skeletal structures are unremarkable. IMPRESSION: No active  disease. Electronically Signed   By: Abelardo Diesel M.D.   On: 02/03/2021 15:28   ECHOCARDIOGRAM COMPLETE  Result Date: 02/04/2021    ECHOCARDIOGRAM REPORT   Patient Name:   Steven [name] Date of Exam: 02/04/2021 Medical Rec #:  [id number]           Height:       70.5 in Accession #:    [id number]          Weight:       212.1 lb Date of Birth:  1960/09/16           BSA:          2.151 m Patient Age:    60 years            BP:           146/95 mmHg Patient Gender: M                   HR:           65 bpm. Exam Location:  Inpatient Procedure: 2D Echo, Cardiac Doppler and Color Doppler Indications:    CAD  History:        Patient has no prior history of Echocardiogram examinations.                 Acute MI and CAD; Cardiac cath 11/24.  Sonographer:    Merrie Roof RDCS Referring Phys: [id number] [place]  1. Left ventricular ejection fraction, by estimation, is 55 to 60%. The left ventricle has normal function. The left ventricle has no regional wall motion abnormalities. Left ventricular diastolic parameters were normal.  2. Right ventricular systolic function is normal. The right ventricular size is normal. There is normal pulmonary artery systolic pressure.  3. Left atrial size was moderately dilated.  4. The mitral valve is abnormal. Mild mitral valve regurgitation. No evidence of mitral stenosis.  5. The aortic valve is tricuspid. There is mild calcification of the aortic valve. Aortic valve regurgitation is not visualized. Aortic valve sclerosis is present, with no evidence of aortic valve stenosis.  6. The inferior vena cava is normal in size with greater than 50% respiratory variability, suggesting right atrial pressure of 3 mmHg. FINDINGS  Left Ventricle: Left ventricular ejection fraction, by estimation, is 55  to 60%. The left ventricle has normal function. The left ventricle has no regional wall motion abnormalities. The left ventricular internal cavity size was normal in size.  There is  no left ventricular hypertrophy. Left ventricular diastolic parameters were normal. Right Ventricle: The right ventricular size is normal. No increase in right ventricular wall thickness. Right ventricular systolic function is normal. There is normal pulmonary artery systolic pressure. The tricuspid regurgitant velocity is 2.25 m/s, and  with an assumed right atrial pressure of 3 mmHg, the estimated right ventricular systolic pressure is 49.2 mmHg. Left Atrium: Left atrial size was moderately dilated. Right Atrium: Right atrial size was normal in size. Pericardium: There is no evidence of pericardial effusion. Mitral Valve: The mitral valve is abnormal. There is mild thickening of the mitral valve leaflet(s). Mild mitral valve regurgitation. No evidence of mitral valve stenosis. Tricuspid Valve: The tricuspid valve is normal in structure. Tricuspid valve regurgitation is not demonstrated. No evidence of tricuspid stenosis. Aortic Valve: The aortic valve is tricuspid. There is mild calcification of the aortic valve. Aortic valve regurgitation is not visualized. Aortic valve sclerosis is present, with no evidence of aortic valve stenosis. Aortic valve mean gradient measures 5.0 mmHg. Aortic valve peak gradient measures 9.7 mmHg. Aortic valve area, by VTI measures 3.04 cm. Pulmonic Valve: The pulmonic valve was normal in structure. Pulmonic valve regurgitation is not visualized. No evidence of pulmonic stenosis. Aorta: The aortic root is normal in size and structure. Venous: The inferior vena cava is normal in size with greater than 50% respiratory variability, suggesting right atrial pressure of 3 mmHg. IAS/Shunts: No atrial level shunt detected by color flow Doppler.  LEFT VENTRICLE PLAX 2D LVIDd:         4.20 cm      Diastology LVIDs:         2.80 cm      LV e' medial:    6.96 cm/s LV PW:         1.30 cm      LV E/e' medial:  11.8 LV IVS:        1.20 cm      LV e' lateral:   10.10 cm/s LVOT diam:     2.00  cm      LV E/e' lateral: 8.1 LV SV:         97 LV SV Index:   45 LVOT Area:     3.14 cm  LV Volumes (MOD) LV vol d, MOD A4C: 122.0 ml LV vol s, MOD A4C: 46.4 ml LV SV MOD A4C:     122.0 ml RIGHT VENTRICLE RV Basal diam:  3.10 cm LEFT ATRIUM             Index        RIGHT ATRIUM           Index LA diam:        5.30 cm 2.46 cm/m   RA Area:     21.20 cm LA Vol (A2C):   81.0 ml 37.65 ml/m  RA Volume:   63.00 ml  29.28 ml/m LA Vol (A4C):   53.0 ml 24.64 ml/m LA Biplane Vol: 71.7 ml 33.33 ml/m  AORTIC VALVE AV Area (Vmax):    3.04 cm AV Area (Vmean):   2.86 cm AV Area (VTI):     3.04 cm AV Vmax:           156.00 cm/s AV Vmean:          101.000  cm/s AV VTI:            0.320 m AV Peak Grad:      9.7 mmHg AV Mean Grad:      5.0 mmHg LVOT Vmax:         151.00 cm/s LVOT Vmean:        91.900 cm/s LVOT VTI:          0.310 m LVOT/AV VTI ratio: 0.97  AORTA Ao Asc diam: 3.60 cm MITRAL VALVE               TRICUSPID VALVE MV Area (PHT): 3.85 cm    TR Peak grad:   20.2 mmHg MV Decel Time: 197 msec    TR Vmax:        225.00 cm/s MV E velocity: 82.30 cm/s MV A velocity: 83.70 cm/s  SHUNTS MV E/A ratio:  0.98        Systemic VTI:  0.31 m                            Systemic Diam: 2.00 cm Jenkins Rouge MD Electronically signed by Jenkins Rouge MD Signature Date/Time: 02/04/2021/9:05:20 AM    Final     Cardiac Studies   Echo and cath as above.  Patient Profile     60 y.o. male  with CML, HTN, HLD and former tobacco abuse admitted with a NSTEMI on 02/03/21. Cardiac cath with severe mid Circumflex stenosis and severe mid LAD stenosis treated with PCI.   Assessment & Plan   Principal Problem:   NSTEMI (non-ST elevated myocardial infarction) [facility]) Active Problems:   Substernal chest pain   Essential hypertension   CML (chronic myelocytic leukemia) (HCC)   Unstable angina (HCC)   NSTEMI HTN  - mild pleuritic chest pain though improving. Would continue colchicine until pain resolves or until follow up, however  having diarrhea, will decrease dose to 0.3 mg BID. - ASA 81 mg daily - Brilinta 90 mg BID - changing pravastatin to atorvastatin 80 mg daily - lisinopril 5 mg daily - changing atenolol to metoprolol 25 mg BID - BP stable at this time, hold HCTZ until follow up.  - prn nitro for CP homegoing.   CML  - continue home therapy per Heme/Onc with desatinib  Follow up: Has a new pt appt with Dr. Gwenlyn Found however will now be patient of Dr. Audie Box, recommend follow up in 2-3 weeks. Patient works as an Clinical biochemist and physical job. We discussed radial precautions and time recommended out of work. Patient would like a bit longer to get better/stronger, we will provide work excuse for 2 weeks, to be reassessed at follow up.  For questions or updates, please contact [place] Please consult [email or web address] for contact info under        Signed, Elouise Munroe, MD  02/05/2021, 9:16 AM

## 2021-02-06 ENCOUNTER — Other Ambulatory Visit: Payer: Self-pay | Admitting: Cardiology

## 2021-02-06 MED ORDER — TICAGRELOR 90 MG PO TABS: 90.0000 mg | ORAL_TABLET | Freq: Two times a day (BID) | ORAL | 6 refills | Status: DC

## 2021-02-06 MED ORDER — ATORVASTATIN CALCIUM 80 MG PO TABS: 80.0000 mg | ORAL_TABLET | Freq: Every day | ORAL | 6 refills | Status: DC

## 2021-02-06 MED ORDER — LISINOPRIL 5 MG PO TABS: 5.0000 mg | ORAL_TABLET | Freq: Every day | ORAL | 6 refills | Status: DC

## 2021-02-06 MED ORDER — METOPROLOL TARTRATE 25 MG PO TABS: 25.0000 mg | ORAL_TABLET | Freq: Two times a day (BID) | ORAL | 6 refills | Status: DC

## 2021-02-06 MED ORDER — COLCHICINE 0.6 MG PO TABS: 0.3000 mg | ORAL_TABLET | Freq: Two times a day (BID) | ORAL | 1 refills | Status: DC

## 2021-02-07 ENCOUNTER — Telehealth: Payer: Self-pay | Admitting: Cardiovascular Disease

## 2021-02-07 ENCOUNTER — Telehealth: Payer: Self-pay

## 2021-02-07 NOTE — Telephone Encounter (Signed)
Pt recently d/c from [facility] - S/P MI requiring 2 stents placed. He is scheduled to see Dr Bobby Rumpf in Jan, but wants to know if he needs to be seen sooner? They have changed some of his meds.   Dr Bobby Rumpf notified of above. He does not need to see pt sooner than Jan 2023 appt. I called pt and told him.

## 2021-02-07 NOTE — Telephone Encounter (Signed)
Called patient, advised of message from MD. Patient verbalized understanding.  Scheduled OV for hospital follow up with Dr.O'Neal as well.

## 2021-02-07 NOTE — Telephone Encounter (Signed)
Called patient back. He states that since starting the medications (he is not sure which one is causing the issue) but he has had nothing but diarrhea with everything he eats and drinks. He states it has only started happening since starting all the new medications. I did advise I would send a message over for Dr.O'Neal to review and see his thoughts and I would call him back.   He also states they told him he was unable to be seen until January- I advised that we would get him in, but wanted to check with MD first- next week okay for follow up?   Thanks!

## 2021-02-07 NOTE — Telephone Encounter (Signed)
Pt c/o medication issue:  1. Name of Medication: Patient is not sure  2. How are you currently taking this medication (dosage and times per day)?   3. Are you having a reaction (difficulty breathing--STAT)? Diarrhea  4. What is your medication issue? Patient is not sure which medication is causing the diarrhea.  The patient is down to water at this point and everything is going straight through him He was put on multiple medications after his heart attack. He is not sure what to do

## 2021-02-10 ENCOUNTER — Telehealth (HOSPITAL_COMMUNITY): Payer: Self-pay

## 2021-02-10 NOTE — Telephone Encounter (Signed)
Called and spoke with pt in regards to CR, pt stated he is not interested at this time.   Closed referral 

## 2021-02-10 NOTE — Telephone Encounter (Signed)
Will check insurance benefits closer to scheduling and/or into the new year 2023. 

## 2021-02-17 ENCOUNTER — Telehealth: Payer: Self-pay | Admitting: Cardiovascular Disease

## 2021-02-17 NOTE — Telephone Encounter (Signed)
02/17/21 Short term disability forms were given to Dr.O'neal nurse.

## 2021-02-17 NOTE — Telephone Encounter (Signed)
02/17/21 When pt is in office for appt on 02/18/21 patient will complete billing & release, and forms fee.

## 2021-02-18 ENCOUNTER — Ambulatory Visit: Payer: BC Managed Care – PPO | Admitting: Cardiovascular Disease

## 2021-02-18 ENCOUNTER — Encounter: Payer: Self-pay | Admitting: Cardiovascular Disease

## 2021-02-18 ENCOUNTER — Other Ambulatory Visit: Payer: Self-pay

## 2021-02-18 VITALS — BP 130/72 | HR 81 | Ht 70.5 in | Wt 213.0 lb

## 2021-02-18 DIAGNOSIS — I10 Essential (primary) hypertension: Secondary | ICD-10-CM | POA: Diagnosis not present

## 2021-02-18 DIAGNOSIS — E782 Mixed hyperlipidemia: Secondary | ICD-10-CM | POA: Diagnosis not present

## 2021-02-18 DIAGNOSIS — R1011 Right upper quadrant pain: Secondary | ICD-10-CM | POA: Diagnosis not present

## 2021-02-18 DIAGNOSIS — Z0279 Encounter for issue of other medical certificate: Secondary | ICD-10-CM

## 2021-02-18 DIAGNOSIS — I2510 Atherosclerotic heart disease of native coronary artery without angina pectoris: Secondary | ICD-10-CM

## 2021-02-18 MED ORDER — TICAGRELOR 90 MG PO TABS: 90.0000 mg | ORAL_TABLET | Freq: Two times a day (BID) | ORAL | 3 refills | Status: DC

## 2021-02-18 NOTE — Progress Notes (Signed)
Cardiology Office Note:   Date:  02/18/2021  NAME:  Steven [name]    MRN: [id number] DOB:  07/23/1960   PCP:  Enid Skeens., MD  Cardiologist:  Evalina Field, MD  Electrophysiologist:  None   Referring MD: Enid Skeens., MD   Chief Complaint  Patient presents with   Follow-up    Post hospital.   History of Present Illness:   Steven [name] is a 60 y.o. male with a hx of CAD/NSTEMI, HTN, HLD, CML who presents for follow-up.  He was admitted on 02/03/2021 with unstable angina.  Taken urgently to the catheterization laboratory where he underwent PCI to a 99% mid circumflex lesion as well as 90% mid LAD lesion.  He was diagnosed with pleuritic chest pain and treated with colchicine.  He reports this is getting better.  He does report some dizziness and lightheadedness.  His blood pressure today is 130/72.  Symptoms occur with position change or with prolonged standing.  He is drinking plenty of water.  I recommended to stop his light fosinopril to see how he does.  His LDL cholesterol was not that elevated.  He does need repeat lipids in the next few weeks.  His right radial cath site is clean and dry.  He denies any significant shortness of breath.  I have encouraged him to be more active.  He is started doing walks around his property.  Seems to be doing better.  EKG shows sinus rhythm with no acute ischemic changes or evidence of infarction.  Cardiovascular examination is normal.  We did discuss that his echocardiogram showed normal heart function.  He has had no significant heart damage.  I believe he will continue to make a full recovery and do well.  We did stress the importance of aspirin and ticagrelor.  He will take these.  Problem List NSTEMI/CAD -02/03/2021 -PCI to mid LCX/mid LAD 2. HLD -T chol 132, HDL 32, LDL 72, TG 140 3. HTN 4. CML  Past Medical History: Past Medical History:  Diagnosis Date   Chest pain    a. Cath 2007:  luminal irregs, normal LVF  (Dr. Doylene Canard) - was placed on plavix afterwards.;   b.  ETT-MV 11/13:   mild inferior ischemia, EF 59%.   CKD (chronic kidney disease), stage II    CML (chronic myeloid leukemia) ([place])    a. Dx 2011 - followed @ [facility] - remission   Hepatitis    IN THE 1980'S   Hyperlipidemia    a. Dx 2006   Hypertension    a. Dx 2006   Pneumonia    hx of PNA    Past Surgical History: Past Surgical History:  Procedure Laterality Date   CARDIAC CATHETERIZATION  02/16/2012   30% tubular prox RCA stenosis, normal coronaries otherwise; LVEF 65%, no WMAs   CORONARY/GRAFT ACUTE MI REVASCULARIZATION N/A 02/03/2021   Procedure: Coronary/Graft Acute MI Revascularization;  Surgeon: Burnell Blanks, MD;  Location: [place] [facility];  Service: Cardiovascular;  Laterality: N/A;   LEFT HEART CATH AND CORONARY ANGIOGRAPHY N/A 02/03/2021   Procedure: LEFT HEART CATH AND CORONARY ANGIOGRAPHY;  Surgeon: Burnell Blanks, MD;  Location: [facility];  Service: Cardiovascular;  Laterality: N/A;   LEFT HEART CATHETERIZATION WITH CORONARY ANGIOGRAM N/A 02/16/2012   Procedure: LEFT HEART CATHETERIZATION WITH CORONARY ANGIOGRAM;  Surgeon: Josue Hector, MD;  Location: [facility] CATH LAB;  Service: Cardiovascular;  Laterality: N/A;   Left Rotator Cuff  Repair     a. approx. 2003    Current Medications: Current Meds  Medication Sig   aspirin 81 MG chewable tablet Chew 1 tablet (81 mg total) by mouth daily.   atorvastatin (LIPITOR) 80 MG tablet Take 1 tablet (80 mg total) by mouth daily.   dasatinib (SPRYCEL) 50 MG tablet Take 50 mg by mouth every morning.   metoprolol tartrate (LOPRESSOR) 25 MG tablet Take 1 tablet (25 mg total) by mouth 2 (two) times daily.   nitroGLYCERIN (NITROSTAT) 0.4 MG SL tablet Place 1 tablet (0.4 mg total) under the tongue every 5 (five) minutes as needed for chest pain.   [DISCONTINUED] colchicine 0.6 MG tablet Take 0.5 tablets (0.3 mg total) by mouth 2 (two) times  daily.   [DISCONTINUED] lisinopril (ZESTRIL) 5 MG tablet Take 1 tablet (5 mg total) by mouth daily.   [DISCONTINUED] ticagrelor (BRILINTA) 90 MG TABS tablet Take 1 tablet (90 mg total) by mouth 2 (two) times daily.     Allergies:    Patient has no known allergies.   Social History: Social History   Socioeconomic History   Marital status: Divorced    Spouse name: Not on file   Number of children: Not on file   Years of education: Not on file   Highest education level: Not on file  Occupational History   Not on file  Tobacco Use   Smoking status: Former    Packs/day: 1.00    Years: 30.00    Pack years: 30.00    Types: Cigarettes    Quit date: 02/14/2005    Years since quitting: 16.0   Smokeless tobacco: Never   Tobacco comments:    smoked 1-1/12 ppd for better part of 30 years  Substance and Sexual Activity   Alcohol use: Yes    Comment: Rare drink   Drug use: No   Sexual activity: Not on file  Other Topics Concern   Not on file  Social History Narrative   Lives in [place] by himself.  Works as an Clinical biochemist.   Social Determinants of Health   Financial Resource Strain: Not on file  Food Insecurity: Not on file  Transportation Needs: Not on file  Physical Activity: Not on file  Stress: Not on file  Social Connections: Not on file     Family History: The patient's family history includes Heart attack in his father; Other in his brother and mother.  ROS:   All other ROS reviewed and negative. Pertinent positives noted in the HPI.     EKGs/Labs/Other Studies Reviewed:   The following studies were personally reviewed by me today:  EKG:  EKG is ordered today.  The ekg ordered today demonstrates normal sinus rhythm heart rate 81, no acute ischemic changes, single PAC noted, and was personally reviewed by me.   TTE 02/04/2021  1. Left ventricular ejection fraction, by estimation, is 55 to 60%. The  left ventricle has normal function. The left ventricle has no  regional  wall motion abnormalities. Left ventricular diastolic parameters were  normal.   2. Right ventricular systolic function is normal. The right ventricular  size is normal. There is normal pulmonary artery systolic pressure.   3. Left atrial size was moderately dilated.   4. The mitral valve is abnormal. Mild mitral valve regurgitation. No  evidence of mitral stenosis.   5. The aortic valve is tricuspid. There is mild calcification of the  aortic valve. Aortic valve regurgitation is not visualized. Aortic valve  sclerosis is present, with no evidence of aortic valve stenosis.   6. The inferior vena cava is normal in size with greater than 50%  respiratory variability, suggesting right atrial pressure of 3 mmHg.   LHC 02/03/2021   Prox RCA to Mid RCA lesion is 30% stenosed.   Dist RCA lesion is 30% stenosed.   Prox Cx to Mid Cx lesion is 99% stenosed.   Mid LAD lesion is 90% stenosed.   A drug-eluting stent was successfully placed using a SYNERGY XD 3.0X16.   A drug-eluting stent was successfully placed using a SYNERGY XD 3.0X16.   Post intervention, there is a 0% residual stenosis.   Post intervention, there is a 0% residual stenosis.  Recent Labs: 02/03/2021: ALT 34; B Natriuretic Peptide 33.2; TSH 1.631 02/05/2021: BUN 17; Creatinine, Ser 1.21; Hemoglobin 14.4; Platelets 228; Potassium 4.1; Sodium 136   Recent Lipid Panel    Component Value Date/Time   CHOL 132 02/04/2021 0127   TRIG 140 02/04/2021 0127   HDL 32 (L) 02/04/2021 0127   CHOLHDL 4.1 02/04/2021 0127   VLDL 28 02/04/2021 0127   LDLCALC 72 02/04/2021 0127    Physical Exam:   VS:  BP 130/72 (BP Location: Left Arm, Patient Position: Sitting, Cuff Size: Normal)   Pulse 81   Ht 5' 10.5" (1.791 m)   Wt 213 lb (96.6 kg)   BMI 30.13 kg/m    Wt Readings from Last 3 Encounters:  02/18/21 213 lb (96.6 kg)  02/05/21 211 lb 10.3 oz (96 kg)  10/07/20 225 lb 3.2 oz (102.2 kg)    General: Well nourished, well  developed, in no acute distress Head: Atraumatic, normal size  Eyes: PEERLA, EOMI  Neck: Supple, no JVD Endocrine: No thryomegaly Cardiac: Normal S1, S2; RRR; no murmurs, rubs, or gallops Lungs: Clear to auscultation bilaterally, no wheezing, rhonchi or rales  Abd: Soft, nontender, no hepatomegaly  Ext: No edema, pulses 2+ Musculoskeletal: No deformities, BUE and BLE strength normal and equal Skin: Warm and dry, no rashes   Neuro: Alert and oriented to person, place, time, and situation, CNII-XII grossly intact, no focal deficits  Psych: Normal mood and affect   ASSESSMENT:   Da Authement is a 60 y.o. male who presents for the following: 1. Coronary artery disease involving native coronary artery of native heart without angina pectoris   2. Mixed hyperlipidemia   3. Primary hypertension     PLAN:   1. Coronary artery disease involving native coronary artery of native heart without angina pectoris 2. Mixed hyperlipidemia -He was admitted to the hospital on 02/03/2021.  He had unstable angina.  He had alarming EKG changes and underwent urgent cardiac catheterization.  This demonstrated 99% left circumflex disease as well as 90% LAD disease.  He underwent PCI to both lesions. -He was diagnosed with pleuritic chest pain and placed on colchicine.  He has no symptoms of that today.  He does describe some tightness in his chest but I believe this is all noncardiac.  I recommended to stop colchicine. -He does describe some dizziness and lightheadedness.  Would recommend he stop lisinopril 5 mg daily.  He should continue metoprolol tartrate 25 mg twice daily. -He was taken out of work between 02/03/2021 and 02/20/2021.  He will return to work on Monday.  He has no restrictions. -I encouraged him to resume driving. -He is on Lipitor 80 mg daily.  He will come back in 3 months we will check fasting lipids before  that time.  Goal LDL less than 70.  I believe he will get their on Lipitor  80. -His blood pressure stable.  Stopping lisinopril as above.  He will continue metoprolol. -I have encouraged him to change his diet.  We discussed the Mediterranean diet.  He should reduce red meats and fatty foods.  He will also increase vegetables and fresh fruits. -We also discussed cardiac rehab.  He reports he would like to do his exercise regimen on his own.  I recommended 150 minutes of moderate to vigorous physical activity per week.  3. Primary hypertension -Off lisinopril due to lightheadedness.  Symptoms sound like orthostasis.  Recommended to continue with adequate hydration as well as metoprolol tartrate 25 mg twice daily.   Disposition: Return in about 3 months (around 05/19/2021).  Medication Adjustments/Labs and Tests Ordered: Current medicines are reviewed at length with the patient today.  Concerns regarding medicines are outlined above.  Orders Placed This Encounter  Procedures   Lipid panel   EKG 12-Lead    Meds ordered this encounter  Medications   DISCONTD: ticagrelor (BRILINTA) 90 MG TABS tablet    Sig: Take 1 tablet (90 mg total) by mouth 2 (two) times daily.    Dispense:  180 tablet    Refill:  3   ticagrelor (BRILINTA) 90 MG TABS tablet    Sig: Take 1 tablet (90 mg total) by mouth 2 (two) times daily.    Dispense:  180 tablet    Refill:  3     Patient Instructions  Medication Instructions:  STOP Lisinopril  STOP Colchicine   *If you need a refill on your cardiac medications before your next appointment, please call your pharmacy*   Lab Work: LIPID (1 week before 3 month appointment)   If you have labs (blood work) drawn today and your tests are completely normal, you will receive your results only by: [place] (if you have MyChart) OR A paper copy in the mail If you have any lab test that is abnormal or we need to change your treatment, we will call you to review the results.  Follow-Up: At [facility], you and your health needs  are our priority.  As part of our continuing mission to provide you with exceptional heart care, we have created designated Provider Care Teams.  These Care Teams include your primary Cardiologist (physician) and Advanced Practice Providers (APPs -  Physician Assistants and Nurse Practitioners) who all work together to provide you with the care you need, when you need it.  We recommend signing up for the patient portal called "MyChart".  Sign up information is provided on this After Visit Summary.  MyChart is used to connect with patients for Virtual Visits (Telemedicine).  Patients are able to view lab/test results, encounter notes, upcoming appointments, etc.  Non-urgent messages can be sent to your provider as well.   To learn more about what you can do with MyChart, go to [email or web address].    Your next appointment:   3 month(s)  The format for your next appointment:   In Person  Provider:   Sande Rives, PA-C or Almyra Deforest, PA-C    Then, Evalina Field, MD will plan to see you again in 12 month(s).     Time Spent with Patient: I have spent a total of 35 minutes with patient reviewing hospital notes, telemetry, EKGs, labs and examining the patient as well as establishing an assessment and plan that was discussed with the patient.  >  50% of time was spent in direct patient care.  Signed, Addison Naegeli. Audie Box, MD, [place]  [address] [place],  [address] [phone]  02/18/2021 9:38 AM

## 2021-02-18 NOTE — Patient Instructions (Signed)
Medication Instructions:  STOP Lisinopril  STOP Colchicine   *If you need a refill on your cardiac medications before your next appointment, please call your pharmacy*   Lab Work: LIPID (1 week before 3 month appointment)   If you have labs (blood work) drawn today and your tests are completely normal, you will receive your results only by: [place] (if you have MyChart) OR A paper copy in the mail If you have any lab test that is abnormal or we need to change your treatment, we will call you to review the results.  Follow-Up: At [facility], you and your health needs are our priority.  As part of our continuing mission to provide you with exceptional heart care, we have created designated Provider Care Teams.  These Care Teams include your primary Cardiologist (physician) and Advanced Practice Providers (APPs -  Physician Assistants and Nurse Practitioners) who all work together to provide you with the care you need, when you need it.  We recommend signing up for the patient portal called "MyChart".  Sign up information is provided on this After Visit Summary.  MyChart is used to connect with patients for Virtual Visits (Telemedicine).  Patients are able to view lab/test results, encounter notes, upcoming appointments, etc.  Non-urgent messages can be sent to your provider as well.   To learn more about what you can do with MyChart, go to [email or web address].    Your next appointment:   3 month(s)  The format for your next appointment:   In Person  Provider:   Sande Rives, PA-C or Almyra Deforest, PA-C    Then, Evalina Field, MD will plan to see you again in 12 month(s).

## 2021-02-22 ENCOUNTER — Telehealth: Payer: Self-pay | Admitting: Cardiovascular Disease

## 2021-02-22 NOTE — Telephone Encounter (Signed)
Refoel is calling stating he did not get a stent card to keep with him at all times.

## 2021-02-22 NOTE — Telephone Encounter (Signed)
Returned call to patient who states that his dad had a heart cath with stents in the past and had gotten a stent card but states that he did not receive one with his stent placement last month. Advised patient that Stent cards are not typically given anymore as information is located in [facility] record. Patient verbalized understanding.

## 2021-02-25 ENCOUNTER — Ambulatory Visit: Payer: BC Managed Care – PPO | Admitting: Cardiovascular Disease

## 2021-02-25 DIAGNOSIS — K824 Cholesterolosis of gallbladder: Secondary | ICD-10-CM | POA: Diagnosis not present

## 2021-02-25 DIAGNOSIS — K7689 Other specified diseases of liver: Secondary | ICD-10-CM | POA: Diagnosis not present

## 2021-02-25 DIAGNOSIS — R1011 Right upper quadrant pain: Secondary | ICD-10-CM | POA: Diagnosis not present

## 2021-03-02 NOTE — Telephone Encounter (Signed)
12/21 COMPLETE °

## 2021-03-08 DIAGNOSIS — K811 Chronic cholecystitis: Secondary | ICD-10-CM | POA: Insufficient documentation

## 2021-03-08 DIAGNOSIS — K429 Umbilical hernia without obstruction or gangrene: Secondary | ICD-10-CM | POA: Insufficient documentation

## 2021-03-08 DIAGNOSIS — K824 Cholesterolosis of gallbladder: Secondary | ICD-10-CM | POA: Insufficient documentation

## 2021-04-04 ENCOUNTER — Other Ambulatory Visit: Payer: Self-pay

## 2021-04-04 ENCOUNTER — Encounter: Payer: Self-pay | Admitting: Oncology

## 2021-04-04 ENCOUNTER — Inpatient Hospital Stay: Payer: BC Managed Care – PPO | Attending: Oncology

## 2021-04-04 DIAGNOSIS — C9210 Chronic myeloid leukemia, BCR/ABL-positive, not having achieved remission: Secondary | ICD-10-CM | POA: Insufficient documentation

## 2021-04-04 DIAGNOSIS — Z79899 Other long term (current) drug therapy: Secondary | ICD-10-CM | POA: Insufficient documentation

## 2021-04-04 NOTE — Progress Notes (Unsigned)
Submitted PA request and clinicals to [facility] [phone] for CPT 81206/81207; pending

## 2021-04-05 DIAGNOSIS — E782 Mixed hyperlipidemia: Secondary | ICD-10-CM | POA: Diagnosis not present

## 2021-04-05 DIAGNOSIS — I10 Essential (primary) hypertension: Secondary | ICD-10-CM | POA: Diagnosis not present

## 2021-04-05 DIAGNOSIS — Z856 Personal history of leukemia: Secondary | ICD-10-CM | POA: Diagnosis not present

## 2021-04-05 DIAGNOSIS — I2510 Atherosclerotic heart disease of native coronary artery without angina pectoris: Secondary | ICD-10-CM | POA: Diagnosis not present

## 2021-04-05 NOTE — Progress Notes (Signed)
[place]  [address] [place],  [place]  [address] [phone]  Clinic Day:  04/11/2021  Referring physician: Enid Skeens., MD  This document serves as a record of services personally performed by Dequincy Macarthur Critchley, MD. It was created on their behalf by [facility] E, a trained medical scribe. The creation of this record is based on the scribe's personal observations and the provider's statements to them.  HISTORY OF PRESENT ILLNESS:  The patient is a 61 y.o. 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  He has been on dasatinib 50 mg daily, from which he has had a complete hematologic, cytogenetic, and molecular response.  He comes in today for routine followup.  Since his last visit, the patient has been doing well.  He continues to tolerate his dasatinib therapy very well.  He denies having any systemic symptoms which concern him for his CML being refractory to his dasatinib.  Of note, since his last visit, the patient did develop heart disease to where at least 2 blood vessels had to be stented.  PHYSICAL EXAM:  Blood pressure 140/82, pulse 89, temperature 98.6 F (37 C), resp. rate 16, height 5' 10.5" (1.791 m), weight 216 lb 3.2 oz (98.1 kg), SpO2 96 %. Wt Readings from Last 3 Encounters:  04/11/21 216 lb 3.2 oz (98.1 kg)  02/18/21 213 lb (96.6 kg)  02/05/21 211 lb 10.3 oz (96 kg)   Body mass index is 30.58 kg/m. Performance status (ECOG): 0 - Asymptomatic Physical Exam Constitutional:      Appearance: Normal appearance. He is not ill-appearing.  HENT:     Mouth/Throat:     Mouth: Mucous membranes are moist.     Pharynx: Oropharynx is clear. No oropharyngeal exudate or posterior oropharyngeal erythema.  Cardiovascular:     Rate and Rhythm: Normal rate and regular rhythm.     Heart sounds: No murmur heard.   No friction rub. No gallop.  Pulmonary:     Effort: Pulmonary effort is normal. No  respiratory distress.     Breath sounds: Normal breath sounds. No wheezing, rhonchi or rales.  Abdominal:     General: Bowel sounds are normal. There is no distension.     Palpations: Abdomen is soft. There is no mass.     Tenderness: There is no abdominal tenderness.  Musculoskeletal:        General: No swelling.     Right lower leg: No edema.     Left lower leg: No edema.  Lymphadenopathy:     Cervical: No cervical adenopathy.     Upper Body:     Right upper body: No supraclavicular or axillary adenopathy.     Left upper body: No supraclavicular or axillary adenopathy.     Lower Body: No right inguinal adenopathy. No left inguinal adenopathy.  Skin:    General: Skin is warm.     Coloration: Skin is not jaundiced.     Findings: No lesion or rash.  Neurological:     General: No focal deficit present.     Mental Status: He is alert and oriented to person, place, and time. Mental status is at baseline.  Psychiatric:        Mood and Affect: Mood normal.        Behavior: Behavior normal.        Thought Content: Thought content normal.    LABS:   CBC Latest Ref Rng & Units 02/05/2021 02/04/2021  02/03/2021  WBC 4.0 - 10.5 K/uL 7.8 7.8 8.1  Hemoglobin 13.0 - 17.0 g/dL 14.4 14.3 16.8  Hematocrit 39.0 - 52.0 % 42.2 42.1 47.0  Platelets 150 - 400 K/uL 228 228 283   CMP Latest Ref Rng & Units 02/05/2021 02/04/2021 02/03/2021  Glucose 70 - 99 mg/dL 102(H) 91 122(H)  BUN 8 - 23 mg/dL 17 15 17   Creatinine 0.61 - 1.24 mg/dL 1.21 1.22 1.28(H)  Sodium 135 - 145 mmol/L 136 139 136  Potassium 3.5 - 5.1 mmol/L 4.1 4.7 4.0  Chloride 98 - 111 mmol/L 106 110 105  CO2 22 - 32 mmol/L 24 22 20(L)  Calcium 8.9 - 10.3 mg/dL 9.3 9.2 9.6  Total Protein 6.5 - 8.1 g/dL - - 7.2  Total Bilirubin 0.3 - 1.2 mg/dL - - 1.4(H)  Alkaline Phos 38 - 126 U/L - - 64  AST 15 - 41 U/L - - 48(H)  ALT 0 - 44 U/L - - 34   PCR testing pending   ASSESSMENT & PLAN:  Assessment/Plan:  A 61 y.o. male with chronic  myelogenous leukemia.  I am extremely pleased as his labs continue to show he is in a complete molecular response.  This reflects the efficacy of his dasatinib at 50 mg.  He will continue to take this medication at 50 mg daily.  As he is doing very well from a CML perspective, I will see him back in another 6 months for repeat clinical assessment.  The patient understands all the plans discussed today and is in agreement with them.    I, Rita Ohara, am acting as scribe for Marice Potter, MD    I have reviewed this report as typed by the medical scribe, and it is complete and accurate.  Dequincy Macarthur Critchley, MD

## 2021-04-06 ENCOUNTER — Encounter: Payer: Self-pay | Admitting: Oncology

## 2021-04-06 ENCOUNTER — Other Ambulatory Visit: Payer: Self-pay

## 2021-04-06 DIAGNOSIS — C9210 Chronic myeloid leukemia, BCR/ABL-positive, not having achieved remission: Secondary | ICD-10-CM

## 2021-04-06 DIAGNOSIS — Z79899 Other long term (current) drug therapy: Secondary | ICD-10-CM | POA: Diagnosis not present

## 2021-04-06 NOTE — Progress Notes (Unsigned)
Received approval from [facility] for CPT 81206/81207 BCR/ABL1  VALID 04/04/2020 - 05/04/2021

## 2021-04-11 ENCOUNTER — Telehealth: Payer: Self-pay | Admitting: Cardiovascular Disease

## 2021-04-11 ENCOUNTER — Inpatient Hospital Stay: Payer: BC Managed Care – PPO | Admitting: Oncology

## 2021-04-11 ENCOUNTER — Other Ambulatory Visit: Payer: Self-pay | Admitting: Oncology

## 2021-04-11 ENCOUNTER — Other Ambulatory Visit: Payer: Self-pay

## 2021-04-11 ENCOUNTER — Telehealth: Payer: Self-pay | Admitting: Oncology

## 2021-04-11 VITALS — BP 140/82 | HR 89 | Temp 98.6°F | Resp 16 | Ht 70.5 in | Wt 216.2 lb

## 2021-04-11 DIAGNOSIS — C9210 Chronic myeloid leukemia, BCR/ABL-positive, not having achieved remission: Secondary | ICD-10-CM

## 2021-04-11 MED ORDER — ROSUVASTATIN CALCIUM 40 MG PO TABS: 40.0000 mg | ORAL_TABLET | Freq: Every day | ORAL | 5 refills | Status: DC

## 2021-04-11 NOTE — Telephone Encounter (Signed)
Patient of Dr. Audie Box who reports muscle aches, back aches since starting atorvastatin. He previously took pravastatin at a lower dose and had myalgias. He may also have tried simvastatin in the past per med history.   Advised will send message to MD Advised to hold atorvastatin given symptoms   Last LDL was 72 in hospital - has not been rechecked

## 2021-04-11 NOTE — Telephone Encounter (Signed)
Spoke with patient and relayed message from MD He will stop atorvastatin and start rosuvastatin next week, to allow some time for symptoms to hopefully resolve before starting new med. Advised to take at least 2 weeks and call if symptoms resume with crestor.

## 2021-04-11 NOTE — Telephone Encounter (Signed)
Patient has been scheduled for follow-up visit per 04/11/21 los. Pt given an appt calendar with date and time.

## 2021-04-11 NOTE — Telephone Encounter (Signed)
Pt c/o medication issue:  1. Name of Medication: atorvastatin (LIPITOR) 80 MG tablet  2. How are you currently taking this medication (dosage and times per day)? Take 1 tablet (80 mg total) by mouth daily.  3. Are you having a reaction (difficulty breathing--STAT)?   4. What is your medication issue? Muscles ache, back is hurting.    Patient wants to know if there is something else he can be put on, or a lower dose.

## 2021-04-11 NOTE — Telephone Encounter (Signed)
Steven Rile, MD  Steven Levy, RN Cc: Caprice Beaver, LPN Caller: Unspecified (Today,  3:49 PM) Have we tried crestor? Let's try 40 mg crestor.   [place] T. Audie Box, MD, [place]  [address] [place]  [place], [place] [address]  [phone]  4:11 PM

## 2021-04-13 LAB — BCR-ABL1, CML/ALL, PCR, QUANT: Interpretation (BCRAL):: NEGATIVE

## 2021-04-19 ENCOUNTER — Telehealth: Payer: Self-pay | Admitting: Cardiovascular Disease

## 2021-04-19 NOTE — Telephone Encounter (Signed)
Spoke with patient  He is unable to tolerate atorvastatin and rosuvastatin d/t muscle aches, myalgias  He was supposed to do labs before his next visit -- would like to know if this is needed, or hold off on this given statin-intolerance and possible further medication changes  Advised other non-statin meds are sometimes Rx'ed by lipid clinic MD or pharmacy team   Advised will send to MD to review

## 2021-04-19 NOTE — Telephone Encounter (Signed)
Patient returning call.

## 2021-04-19 NOTE — Telephone Encounter (Signed)
Left message to call back   Per chart review, recently changed last week from atorvastatin to rosuvastatin

## 2021-04-19 NOTE — Telephone Encounter (Signed)
Pt c/o medication issue:  1. Name of Medication: rosuvastatin (CRESTOR) 40 MG tablet  2. How are you currently taking this medication (dosage and times per day)? Take 1 tablet (40 mg total) by mouth daily  3. Are you having a reaction (difficulty breathing--STAT)? Pain in lower back and legs   4. What is your medication issue? oncologists told pt to get off of the statin as it is deteriorating his muscles.. pt is needing a different cholesterol medication

## 2021-04-20 ENCOUNTER — Other Ambulatory Visit: Payer: Self-pay

## 2021-04-20 DIAGNOSIS — E782 Mixed hyperlipidemia: Secondary | ICD-10-CM

## 2021-04-20 NOTE — Telephone Encounter (Signed)
Referral to [facility] LIPID sent today- will send mychart message advising patient they should be contacting for an appointment,.

## 2021-04-21 ENCOUNTER — Telehealth: Payer: Self-pay | Admitting: Pharmacist

## 2021-04-21 ENCOUNTER — Ambulatory Visit (INDEPENDENT_AMBULATORY_CARE_PROVIDER_SITE_OTHER): Payer: BC Managed Care – PPO | Admitting: Pharmacist

## 2021-04-21 ENCOUNTER — Other Ambulatory Visit: Payer: Self-pay

## 2021-04-21 VITALS — BP 146/80 | HR 71 | Resp 17 | Ht 70.5 in | Wt 220.2 lb

## 2021-04-21 DIAGNOSIS — I2510 Atherosclerotic heart disease of native coronary artery without angina pectoris: Secondary | ICD-10-CM | POA: Diagnosis not present

## 2021-04-21 DIAGNOSIS — I214 Non-ST elevation (NSTEMI) myocardial infarction: Secondary | ICD-10-CM

## 2021-04-21 DIAGNOSIS — E785 Hyperlipidemia, unspecified: Secondary | ICD-10-CM | POA: Diagnosis not present

## 2021-04-21 MED ORDER — REPATHA SURECLICK 140 MG/ML ~~LOC~~ SOAJ: 140.0000 mg | SUBCUTANEOUS | 11 refills | Status: DC

## 2021-04-21 NOTE — Progress Notes (Signed)
Patient ID: Steven [name]                 DOB: 1960-10-19                    MRN: [id number]     HPI: Steven [name] is a 61 y.o. male patient referred to lipid clinic by Dr Audie Box. [facility] is significant for NSTEMI, CAD, HTN, statin intolerance, and angina.  Patient admitted for NSTEMI on 02/03/21.  Patient presents today for CAD consult.  Was placed on atorvastatin after hospital stay. Suffered myalgias and was changed to rosuvastatin.  Both caused muscle pain, back pain, and flu like symptoms.  Quit taking rosuvastatin 2 days ago and symptoms are subsiding.  Since NSTEMI has changed his diet. Is now eating more vegetables, oatmeal, [place] burgers and Cheerios.  Was on low dose pravastatin before hospital admission.  Has a significant fmaily history of CAD on his fathers side.  Father, uncle, and grandfather all had heart attacks.  Quit smoking 17 years ago  Current Medications: N/A  Intolerances:  Atorvastatin Rosuvastatin Pravastatin   Risk Factors:  HTN CAD Angina NSTEMI Family history  LDL goal: <55   Labs:TC 132, Trigs 140, HDL 32, LDL 72 (02/01/21)  Past Medical History:  Diagnosis Date   Chest pain    a. Cath 2007:  luminal irregs, normal LVF (Dr. Doylene Canard) - was placed on plavix afterwards.;   b.  ETT-MV 11/13:   mild inferior ischemia, EF 59%.   CKD (chronic kidney disease), stage II    CML (chronic myeloid leukemia) ([place])    a. Dx 2011 - followed @ [facility] - remission   Hepatitis    IN THE 1980'S   Hyperlipidemia    a. Dx 2006   Hypertension    a. Dx 2006   Pneumonia    hx of PNA    Current Outpatient Medications on File Prior to Visit  Medication Sig Dispense Refill   aspirin 81 MG chewable tablet Chew 1 tablet (81 mg total) by mouth daily.     dasatinib (SPRYCEL) 50 MG tablet Take 50 mg by mouth every morning.     metoprolol tartrate (LOPRESSOR) 25 MG tablet Take 1 tablet (25 mg total) by mouth 2 (two) times daily. 60 tablet 6    nitroGLYCERIN (NITROSTAT) 0.4 MG SL tablet Place 1 tablet (0.4 mg total) under the tongue every 5 (five) minutes as needed for chest pain. 25 tablet 3   rosuvastatin (CRESTOR) 40 MG tablet Take 1 tablet (40 mg total) by mouth daily. 30 tablet 5   ticagrelor (BRILINTA) 90 MG TABS tablet Take 1 tablet (90 mg total) by mouth 2 (two) times daily. 180 tablet 3   No current facility-administered medications on file prior to visit.    Allergies  Allergen Reactions   Atorvastatin Other (See Comments)    myalgias   Rosuvastatin Other (See Comments)    myalgias    Assessment/Plan:  1. Hyperlipidemia - Patient LDL 72 which is above goal of <55.  Aggressive goal selected due to recent coronary event and family history. Unfortunately is high intensity statin intolerant.  Next step would be PCSK9i.    Using demo pen, educated patient on mechanism of action, storage, site selection, administration, and possible adverse effects. Patient voiced understanding.  Will complete PA and activate copay card.  Recheck lipid panel in 2-3 months.  Recommended patient continue to follow new heating habits and to avoid processed  foods and saturated fats.  Patient voiced understanding.  Karren Cobble, PharmD, BCACP, [place], [place], [place], [place], [address] Phone: [phone], Fax: [phone]

## 2021-04-21 NOTE — Telephone Encounter (Signed)
Please complete prior authorization for:  Name of medication, dose, and frequency repatha 140mg  sq q 14 days  Lab Orders Requested? yes  Which labs? Lipid panel  Estimated date for labs to be scheduled 2-3 months  Does patient need activated copay card? yes

## 2021-04-21 NOTE — Addendum Note (Signed)
Addended by: Allean Found on: 04/21/2021 03:15 PM   Modules accepted: Orders

## 2021-04-21 NOTE — Patient Instructions (Addendum)
It was nice meeting you today  We would like your LDL (bad cholesterol) to be less than 55  We will start a new medication called Repatha which you will inject once every 2 weeks  We will complete the prior authorization for you and activate a copay card  Once you start the medication we will recheck your cholesterol in 2-3 months  Please call with any questions!  Karren Cobble, PharmD, BCACP, [place], [place], [place], [place], [address] Phone: [phone], Fax: [phone]

## 2021-04-21 NOTE — Addendum Note (Signed)
Addended by: Allean Found on: 04/21/2021 03:10 PM   Modules accepted: Orders

## 2021-04-21 NOTE — Telephone Encounter (Signed)
Pa for repatha sent to plan : Nadine Counts (Key: [id number]) [place] 140MG /ML auto-injectors  Form [employer] [place] Commercial Electronic Request Form (CB)  Lipid panel ordered and released  Copay card for repatha: RxBin: [id number] RxPCN: [id number] RxGrp: [id number] ID: [id number]

## 2021-04-22 ENCOUNTER — Telehealth: Payer: Self-pay

## 2021-04-22 NOTE — Telephone Encounter (Signed)
pt on the line stating that he was provided with a $5 copay card for Repatha and the pharmacy is trying to charge him $35, I called the pharmacy and verified the copay card info with what you provided in the patient chart, info is correct, pharmacists says the error message she is getting is that the copay card is not eligible for patients with government funded insurance.. please advise

## 2021-04-22 NOTE — Telephone Encounter (Signed)
Can you call pharmacy and troubleshoot issue?

## 2021-04-25 NOTE — Telephone Encounter (Signed)
So I 3 way called repatha ready and the pharmacy to get the situation sorted out and they got it corrected.

## 2021-04-25 NOTE — Telephone Encounter (Signed)
Called  pharmacy and asked to speak to pharmacist and tried to troubleshoot the issue with the pharmacy however they stated that when they ran it it kept saying that the pt has a gov't insurance when on my end it clearly says commercial. At this point I am unsure of what to do. Will route to pharmd pool for advisement copay card is a follows: [place] card: RxBin: [id number] [place]: [id number] RxGrp: [id number] ID: [id number] Cmm stated commercial plan is as follows:

## 2021-05-05 ENCOUNTER — Encounter: Payer: Self-pay | Admitting: Pharmacist

## 2021-05-11 DIAGNOSIS — I2510 Atherosclerotic heart disease of native coronary artery without angina pectoris: Secondary | ICD-10-CM | POA: Diagnosis not present

## 2021-05-11 DIAGNOSIS — T63301A Toxic effect of unspecified spider venom, accidental (unintentional), initial encounter: Secondary | ICD-10-CM | POA: Diagnosis not present

## 2021-05-17 DIAGNOSIS — Z1211 Encounter for screening for malignant neoplasm of colon: Secondary | ICD-10-CM | POA: Diagnosis not present

## 2021-05-18 ENCOUNTER — Ambulatory Visit: Payer: BC Managed Care – PPO | Admitting: Physician Assistant

## 2021-05-30 ENCOUNTER — Telehealth: Payer: Self-pay | Admitting: Cardiovascular Disease

## 2021-05-30 MED ORDER — REPATHA SURECLICK 140 MG/ML ~~LOC~~ SOAJ: 140.0000 mg | SUBCUTANEOUS | 11 refills | Status: DC

## 2021-05-30 NOTE — Telephone Encounter (Signed)
Pt c/o medication issue: ? ?1. Name of Medication:  ?Evolocumab (REPATHA SURECLICK) 539 MG/ML SOAJ ? ?2. How are you currently taking this medication (dosage and times per day)? Was supposed to take last friday  ? ?3. Are you having a reaction (difficulty breathing--STAT)? No ? ?4. What is your medication issue? Patient is calling stating his copay card is needing to be renewed for this medication. He states he was supposed to take this last friday but they were trying to charge him $35 instead of $5. He would like to know if there is something that can be done so it does not have to be renewed every month. Please advise.   ?

## 2021-05-30 NOTE — Telephone Encounter (Signed)
[facility] called the repatha ready for the pt's copay card to be renewed. They were able to renew the card. I called [facility] to cancel the rx so we can retry at the [facility] instead. They were able to get repatha for $5. Pt voiced gratitude and understanding.  ?

## 2021-06-22 DIAGNOSIS — R040 Epistaxis: Secondary | ICD-10-CM | POA: Diagnosis not present

## 2021-06-22 DIAGNOSIS — R067 Sneezing: Secondary | ICD-10-CM | POA: Diagnosis not present

## 2021-07-08 DIAGNOSIS — R067 Sneezing: Secondary | ICD-10-CM | POA: Diagnosis not present

## 2021-07-08 DIAGNOSIS — R0981 Nasal congestion: Secondary | ICD-10-CM | POA: Diagnosis not present

## 2021-07-08 DIAGNOSIS — R059 Cough, unspecified: Secondary | ICD-10-CM | POA: Diagnosis not present

## 2021-07-08 DIAGNOSIS — Z20822 Contact with and (suspected) exposure to covid-19: Secondary | ICD-10-CM | POA: Diagnosis not present

## 2021-07-20 DIAGNOSIS — Z0000 Encounter for general adult medical examination without abnormal findings: Secondary | ICD-10-CM | POA: Diagnosis not present

## 2021-07-20 DIAGNOSIS — E782 Mixed hyperlipidemia: Secondary | ICD-10-CM | POA: Diagnosis not present

## 2021-07-20 DIAGNOSIS — I10 Essential (primary) hypertension: Secondary | ICD-10-CM | POA: Diagnosis not present

## 2021-07-20 DIAGNOSIS — I2510 Atherosclerotic heart disease of native coronary artery without angina pectoris: Secondary | ICD-10-CM | POA: Diagnosis not present

## 2021-07-20 DIAGNOSIS — R0602 Shortness of breath: Secondary | ICD-10-CM | POA: Diagnosis not present

## 2021-07-20 DIAGNOSIS — Z125 Encounter for screening for malignant neoplasm of prostate: Secondary | ICD-10-CM | POA: Diagnosis not present

## 2021-07-21 DIAGNOSIS — E785 Hyperlipidemia, unspecified: Secondary | ICD-10-CM | POA: Diagnosis not present

## 2021-07-21 LAB — LIPID PANEL
Chol/HDL Ratio: 2.7 ratio (ref 0.0–5.0)
Cholesterol, Total: 125 mg/dL (ref 100–199)
HDL: 46 mg/dL (ref >39)
LDL Chol Calc (NIH): 66 mg/dL (ref 0–99)
Triglycerides: 58 mg/dL (ref 0–149)
VLDL Cholesterol Cal: 13 mg/dL (ref 5–40)

## 2021-08-29 NOTE — Progress Notes (Signed)
Cardiology Office Note:   Date:  09/01/2021  NAME:  Steven [name]    MRN: [id number] DOB:  05-26-1960   PCP:  Mckinley Jewel, MD  Cardiologist:  Evalina Field, MD  Electrophysiologist:  None   Referring MD: Mckinley Jewel, MD   Chief Complaint  Patient presents with   Follow-up        History of Present Illness:   Steven [name] is a 61 y.o. male with a hx of CAD/non-STEMI, hypertension, hyperlipidemia who presents for follow-up.  Unable to tolerate statin therapy.  Transition to PCSK9 inhibitor.  Most recent LDL at goal.  He reports overall he is doing well.  Does describe shortness of breath.  Can occur at any time.  Can occur while driving or with activity.  He reports he had the symptoms before his heart attack.  Ejection fraction is normal.  No signs of heart failure.  His blood pressure is elevated so could be contributing.  We discussed increasing his metoprolol.  I also discussed the possibility of sleep apnea given his obesity.  We also discussed that Brilinta could be causing his symptoms.  Switching to Plavix may not be a bad idea.  He reports getting plenty of activity at work.  Works as an Clinical biochemist.  Also takes care of several acres at home.  His primary care physician likely will work-up lung pathology.  I think this is not a bad idea.  I think Brilinta could be clouding the picture.  Also reports flulike symptoms with Repatha.  I will reach out to pharmacy to see if Praluent or has been on praluent may be a better option.  Problem List NSTEMI/CAD -02/03/2021 -PCI to mid LCX/mid LAD 2. HLD -T chol 125, HDL 46, LDL 66, TG 58 3. HTN 4. CML  Past Medical History: Past Medical History:  Diagnosis Date   Chest pain    a. Cath 2007:  luminal irregs, normal LVF (Dr. Doylene Canard) - was placed on plavix afterwards.;   b.  ETT-MV 11/13:   mild inferior ischemia, EF 59%.   CKD (chronic kidney disease), stage II    CML (chronic myeloid leukemia) ([place])    a. Dx  2011 - followed @ [facility] - remission   Hepatitis    IN THE 1980'S   Hyperlipidemia    a. Dx 2006   Hypertension    a. Dx 2006   Pneumonia    hx of PNA    Past Surgical History: Past Surgical History:  Procedure Laterality Date   CARDIAC CATHETERIZATION  02/16/2012   30% tubular prox RCA stenosis, normal coronaries otherwise; LVEF 65%, no WMAs   CORONARY/GRAFT ACUTE MI REVASCULARIZATION N/A 02/03/2021   Procedure: Coronary/Graft Acute MI Revascularization;  Surgeon: Burnell Blanks, MD;  Location: [facility];  Service: Cardiovascular;  Laterality: N/A;   LEFT HEART CATH AND CORONARY ANGIOGRAPHY N/A 02/03/2021   Procedure: LEFT HEART CATH AND CORONARY ANGIOGRAPHY;  Surgeon: Burnell Blanks, MD;  Location: [facility];  Service: Cardiovascular;  Laterality: N/A;   LEFT HEART CATHETERIZATION WITH CORONARY ANGIOGRAM N/A 02/16/2012   Procedure: LEFT HEART CATHETERIZATION WITH CORONARY ANGIOGRAM;  Surgeon: Josue Hector, MD;  Location: [facility] CATH LAB;  Service: Cardiovascular;  Laterality: N/A;   Left Rotator Cuff Repair     a. approx. 2003    Current Medications: Current Meds  Medication Sig   aspirin 81 MG chewable tablet Chew 1 tablet (81 mg total)  by mouth daily.   clopidogrel (PLAVIX) 75 MG tablet Take 1 tablet (75 mg total) by mouth daily.   colchicine 0.6 MG tablet Take 0.6 mg by mouth as needed.   dasatinib (SPRYCEL) 50 MG tablet Take 50 mg by mouth every morning.   Evolocumab (REPATHA SURECLICK) 165 MG/ML SOAJ Inject 140 mg into the skin every 14 (fourteen) days.   nitroGLYCERIN (NITROSTAT) 0.4 MG SL tablet Place 1 tablet (0.4 mg total) under the tongue every 5 (five) minutes as needed for chest pain.   [DISCONTINUED] metoprolol tartrate (LOPRESSOR) 25 MG tablet Take 1 tablet (25 mg total) by mouth 2 (two) times daily.   [DISCONTINUED] ticagrelor (BRILINTA) 90 MG TABS tablet Take 1 tablet (90 mg total) by mouth 2 (two) times daily.      Allergies:    Atorvastatin and Rosuvastatin   Social History: Social History   Socioeconomic History   Marital status: Divorced    Spouse name: Not on file   Number of children: Not on file   Years of education: Not on file   Highest education level: Not on file  Occupational History   Not on file  Tobacco Use   Smoking status: Former    Packs/day: 1.00    Years: 30.00    Total pack years: 30.00    Types: Cigarettes    Quit date: 02/14/2005    Years since quitting: 16.5   Smokeless tobacco: Never   Tobacco comments:    smoked 1-1/12 ppd for better part of 30 years  Substance and Sexual Activity   Alcohol use: Yes    Comment: Rare drink   Drug use: No   Sexual activity: Not on file  Other Topics Concern   Not on file  Social History Narrative   Lives in [place] by himself.  Works as an Clinical biochemist.   Social Determinants of Health   Financial Resource Strain: Not on file  Food Insecurity: Not on file  Transportation Needs: Not on file  Physical Activity: Not on file  Stress: Not on file  Social Connections: Not on file     Family History: The patient's family history includes Heart attack in his father; Other in his brother and mother.  ROS:   All other ROS reviewed and negative. Pertinent positives noted in the HPI.     EKGs/Labs/Other Studies Reviewed:   The following studies were personally reviewed by me today:   TTE 02/04/2021  1. Left ventricular ejection fraction, by estimation, is 55 to 60%. The  left ventricle has normal function. The left ventricle has no regional  wall motion abnormalities. Left ventricular diastolic parameters were  normal.   2. Right ventricular systolic function is normal. The right ventricular  size is normal. There is normal pulmonary artery systolic pressure.   3. Left atrial size was moderately dilated.   4. The mitral valve is abnormal. Mild mitral valve regurgitation. No  evidence of mitral stenosis.   5. The  aortic valve is tricuspid. There is mild calcification of the  aortic valve. Aortic valve regurgitation is not visualized. Aortic valve  sclerosis is present, with no evidence of aortic valve stenosis.   6. The inferior vena cava is normal in size with greater than 50%  respiratory variability, suggesting right atrial pressure of 3 mmHg.   Recent Labs: 02/03/2021: ALT 34; B Natriuretic Peptide 33.2; TSH 1.631 02/05/2021: BUN 17; Creatinine, Ser 1.21; Hemoglobin 14.4; Platelets 228; Potassium 4.1; Sodium 136   Recent Lipid Panel  Component Value Date/Time   CHOL 125 07/21/2021 0811   TRIG 58 07/21/2021 0811   HDL 46 07/21/2021 0811   CHOLHDL 2.7 07/21/2021 0811   CHOLHDL 4.1 02/04/2021 0127   VLDL 28 02/04/2021 0127   LDLCALC 66 07/21/2021 0811    Physical Exam:   VS:  BP (!) 150/92   Pulse 63   Ht '5\' 11"'$  (1.803 m)   Wt 218 lb 9.6 oz (99.2 kg)   SpO2 99%   BMI 30.49 kg/m    Wt Readings from Last 3 Encounters:  09/01/21 218 lb 9.6 oz (99.2 kg)  04/21/21 220 lb 3.2 oz (99.9 kg)  04/11/21 216 lb 3.2 oz (98.1 kg)    General: Well nourished, well developed, in no acute distress Head: Atraumatic, normal size  Eyes: PEERLA, EOMI  Neck: Supple, no JVD Endocrine: No thryomegaly Cardiac: Normal S1, S2; RRR; no murmurs, rubs, or gallops Lungs: Clear to auscultation bilaterally, no wheezing, rhonchi or rales  Abd: Soft, nontender, no hepatomegaly  Ext: No edema, pulses 2+ Musculoskeletal: No deformities, BUE and BLE strength normal and equal Skin: Warm and dry, no rashes   Neuro: Alert and oriented to person, place, time, and situation, CNII-XII grossly intact, no focal deficits  Psych: Normal mood and affect   ASSESSMENT:   Steven [name] is a 61 y.o. male who presents for the following: 1. SOB (shortness of breath) on exertion   2. Coronary artery disease involving native coronary artery of native heart without angina pectoris   3. Mixed hyperlipidemia   4. Primary  hypertension     PLAN:   1. SOB (shortness of breath) on exertion -Symptoms could be related to Brilinta.  Switch to Plavix.  See if this helps.  No signs of heart failure.  Ejection fraction normal.  Minimal nonobstructive disease in the RCA.  LDL at goal.  2. Coronary artery disease involving native coronary artery of native heart without angina pectoris 3. Mixed hyperlipidemia -Non-STEMI in November 2022.  Continue DAPT for 1 year.  He will stop this in November of this year.  He is on aspirin and Brilinta.  Possibly having shortness of breath with Brilinta.  Transition to Plavix 75 mg daily.  Describes flulike illness with Repatha.  I will reach out to pharmacy to see if Praluent or Leqvio would be a better option.  His LDL cholesterol is at goal.  We need to keep it that way.  4. Primary hypertension -Increase metoprolol tartrate to 50 mg twice daily.      Disposition: Return in about 4 months (around 01/01/2022).  Medication Adjustments/Labs and Tests Ordered: Current medicines are reviewed at length with the patient today.  Concerns regarding medicines are outlined above.  No orders of the defined types were placed in this encounter.  Meds ordered this encounter  Medications   clopidogrel (PLAVIX) 75 MG tablet    Sig: Take 1 tablet (75 mg total) by mouth daily.    Dispense:  90 tablet    Refill:  3   DISCONTD: metoprolol tartrate (LOPRESSOR) 50 MG tablet    Sig: Take 1 tablet (50 mg total) by mouth 2 (two) times daily.    Dispense:  180 tablet    Refill:  1   metoprolol tartrate (LOPRESSOR) 50 MG tablet    Sig: Take 1 tablet (50 mg total) by mouth 2 (two) times daily.    Dispense:  180 tablet    Refill:  1    Patient Instructions  Medication Instructions:  INCREASE Metoprolol to 50 mg twice daily   STOP Ticagrelor (Brilinta)  START Plavix 75 mg daily   *If you need a refill on your cardiac medications before your next appointment, please call your  pharmacy*   Follow-Up: At [facility], you and your health needs are our priority.  As part of our continuing mission to provide you with exceptional heart care, we have created designated Provider Care Teams.  These Care Teams include your primary Cardiologist (physician) and Advanced Practice Providers (APPs -  Physician Assistants and Nurse Practitioners) who all work together to provide you with the care you need, when you need it.  We recommend signing up for the patient portal called "MyChart".  Sign up information is provided on this After Visit Summary.  MyChart is used to connect with patients for Virtual Visits (Telemedicine).  Patients are able to view lab/test results, encounter notes, upcoming appointments, etc.  Non-urgent messages can be sent to your provider as well.   To learn more about what you can do with MyChart, go to [email or web address].    Your next appointment:   4 month(s)  The format for your next appointment:   In Person  Provider:   Evalina Field, MD             Time Spent with Patient: I have spent a total of 35 minutes with patient reviewing hospital notes, telemetry, EKGs, labs and examining the patient as well as establishing an assessment and plan that was discussed with the patient.  > 50% of time was spent in direct patient care.  Signed, Addison Naegeli. Audie Box, MD, [place]  [address], [place], [place] [address] [phone]  09/01/2021 9:51 AM

## 2021-09-01 ENCOUNTER — Ambulatory Visit: Payer: BC Managed Care – PPO | Admitting: Cardiovascular Disease

## 2021-09-01 ENCOUNTER — Encounter: Payer: Self-pay | Admitting: Cardiovascular Disease

## 2021-09-01 VITALS — BP 150/92 | HR 63 | Ht 71.0 in | Wt 218.6 lb

## 2021-09-01 DIAGNOSIS — I10 Essential (primary) hypertension: Secondary | ICD-10-CM | POA: Diagnosis not present

## 2021-09-01 DIAGNOSIS — R0602 Shortness of breath: Secondary | ICD-10-CM

## 2021-09-01 DIAGNOSIS — E782 Mixed hyperlipidemia: Secondary | ICD-10-CM | POA: Diagnosis not present

## 2021-09-01 DIAGNOSIS — I2510 Atherosclerotic heart disease of native coronary artery without angina pectoris: Secondary | ICD-10-CM | POA: Diagnosis not present

## 2021-09-01 MED ORDER — METOPROLOL TARTRATE 50 MG PO TABS: 50.0000 mg | ORAL_TABLET | Freq: Two times a day (BID) | ORAL | 1 refills | Status: DC

## 2021-09-01 MED ORDER — CLOPIDOGREL BISULFATE 75 MG PO TABS: 75.0000 mg | ORAL_TABLET | Freq: Every day | ORAL | 3 refills | Status: DC

## 2021-09-01 NOTE — Patient Instructions (Signed)
Medication Instructions:  INCREASE Metoprolol to 50 mg twice daily   STOP Ticagrelor (Brilinta)  START Plavix 75 mg daily   *If you need a refill on your cardiac medications before your next appointment, please call your pharmacy*   Follow-Up: At [facility], you and your health needs are our priority.  As part of our continuing mission to provide you with exceptional heart care, we have created designated Provider Care Teams.  These Care Teams include your primary Cardiologist (physician) and Advanced Practice Providers (APPs -  Physician Assistants and Nurse Practitioners) who all work together to provide you with the care you need, when you need it.  We recommend signing up for the patient portal called "MyChart".  Sign up information is provided on this After Visit Summary.  MyChart is used to connect with patients for Virtual Visits (Telemedicine).  Patients are able to view lab/test results, encounter notes, upcoming appointments, etc.  Non-urgent messages can be sent to your provider as well.   To learn more about what you can do with MyChart, go to [email or web address].    Your next appointment:   4 month(s)  The format for your next appointment:   In Person  Provider:   Evalina Field, MD

## 2021-09-04 ENCOUNTER — Emergency Department (HOSPITAL_COMMUNITY): Payer: BC Managed Care – PPO

## 2021-09-04 ENCOUNTER — Other Ambulatory Visit: Payer: Self-pay

## 2021-09-04 ENCOUNTER — Encounter (HOSPITAL_COMMUNITY): Payer: Self-pay | Admitting: Pharmacy Technician

## 2021-09-04 ENCOUNTER — Emergency Department (HOSPITAL_COMMUNITY)
Admission: EM | Admit: 2021-09-04 | Discharge: 2021-09-04 | Disposition: A | Discharge status: Home / Self Care | Payer: BC Managed Care – PPO | Attending: Emergency Medicine | Admitting: Emergency Medicine

## 2021-09-04 DIAGNOSIS — R110 Nausea: Secondary | ICD-10-CM | POA: Insufficient documentation

## 2021-09-04 DIAGNOSIS — R197 Diarrhea, unspecified: Secondary | ICD-10-CM | POA: Insufficient documentation

## 2021-09-04 DIAGNOSIS — R079 Chest pain, unspecified: Secondary | ICD-10-CM | POA: Diagnosis not present

## 2021-09-04 DIAGNOSIS — R0602 Shortness of breath: Secondary | ICD-10-CM | POA: Diagnosis not present

## 2021-09-04 DIAGNOSIS — I2510 Atherosclerotic heart disease of native coronary artery without angina pectoris: Secondary | ICD-10-CM | POA: Diagnosis not present

## 2021-09-04 DIAGNOSIS — Z79899 Other long term (current) drug therapy: Secondary | ICD-10-CM | POA: Diagnosis not present

## 2021-09-04 DIAGNOSIS — Z7982 Long term (current) use of aspirin: Secondary | ICD-10-CM | POA: Diagnosis not present

## 2021-09-04 DIAGNOSIS — I10 Essential (primary) hypertension: Secondary | ICD-10-CM | POA: Insufficient documentation

## 2021-09-04 DIAGNOSIS — R0789 Other chest pain: Secondary | ICD-10-CM | POA: Diagnosis not present

## 2021-09-04 LAB — BASIC METABOLIC PANEL
Anion gap: 8 (ref 5–15)
BUN: 15 mg/dL (ref 8–23)
CO2: 20 mmol/L — ABNORMAL LOW (ref 22–32)
Calcium: 9.5 mg/dL (ref 8.9–10.3)
Chloride: 110 mmol/L (ref 98–111)
Creatinine, Ser: 1.25 mg/dL — ABNORMAL HIGH (ref 0.61–1.24)
GFR, Estimated: >60 mL/min (ref >60)
Glucose, Bld: 101 mg/dL — ABNORMAL HIGH (ref 70–99)
Potassium: 4 mmol/L (ref 3.5–5.1)
Sodium: 138 mmol/L (ref 135–145)

## 2021-09-04 LAB — TROPONIN I (HIGH SENSITIVITY)
Troponin I (High Sensitivity): 6 ng/L (ref <18)
Troponin I (High Sensitivity): 4 ng/L (ref <18)

## 2021-09-04 LAB — CBC
HCT: 45.1 % (ref 39.0–52.0)
Hemoglobin: 15.8 g/dL (ref 13.0–17.0)
MCH: 30.2 pg (ref 26.0–34.0)
MCHC: 35 g/dL (ref 30.0–36.0)
MCV: 86.2 fL (ref 80.0–100.0)
Platelets: 260 10*3/uL (ref 150–400)
RBC: 5.23 MIL/uL (ref 4.22–5.81)
RDW: 14.6 % (ref 11.5–15.5)
WBC: 9.4 10*3/uL (ref 4.0–10.5)
nRBC: 0 % (ref 0.0–0.2)

## 2021-09-04 MED ORDER — ASPIRIN 325 MG PO TABS
325.0000 mg | ORAL_TABLET | Freq: Every day | ORAL | Status: DC
  Administered 2021-09-04: 325 mg via ORAL
  Filled 2021-09-04: qty 1

## 2021-09-04 NOTE — ED Triage Notes (Signed)
Pt here with central chest pain onset today. Pt states pain feels the same as his previous MI. Pt appears uncomfortable.

## 2021-09-04 NOTE — ED Provider Notes (Signed)
[facility] EMERGENCY DEPARTMENT Provider Note   CSN: [id number] Arrival date & time: 09/04/21  1039     History  Chief Complaint  Patient presents with   Chest Pain    Steven [name] is a 61 y.o. male.  Patient presents to the hospital complaining of substernal chest pain with associated nausea that began approximately 1 to 2 hours prior to arrival.  Patient also endorses shortness of breath since onset.  Patient states that this pain feels identical to the pain he had when he had an NSTEMI last year.  Patient rates the pain as moderate to severe.  Patient took a dose of nitroglycerin prior to arrival with some relief of symptoms.  Patient states that he initially had nausea and feeling similar to reflux, subsequently had diarrhea.  Patient states that he had the same symptoms during his previous MI.  Patient denies radiation of symptoms.  Past medical history significant for NSTEMI with subsequent cath and 2 stents, CML, CAD, hypertension, hyperlipidemia  HPI     Home Medications Prior to Admission medications   Medication Sig Start Date End Date Taking? Authorizing Provider  aspirin 81 MG chewable tablet Chew 1 tablet (81 mg total) by mouth daily. 02/16/12   Arguello, Roger A, PA-C  clopidogrel (PLAVIX) 75 MG tablet Take 1 tablet (75 mg total) by mouth daily. 09/01/21   O'NealCassie Freer, MD  colchicine 0.6 MG tablet Take 0.6 mg by mouth as needed.    [provider]  dasatinib (SPRYCEL) 50 MG tablet Take 50 mg by mouth every morning. 05/10/18   [provider]  Evolocumab (REPATHA SURECLICK) 786 MG/ML SOAJ Inject 140 mg into the skin every 14 (fourteen) days. 05/30/21   O'NealCassie Freer, MD  metoprolol tartrate (LOPRESSOR) 50 MG tablet Take 1 tablet (50 mg total) by mouth 2 (two) times daily. 09/01/21   O'Neal, Cassie Freer, MD  nitroGLYCERIN (NITROSTAT) 0.4 MG SL tablet Place 1 tablet (0.4 mg total) under the tongue every 5 (five)  minutes as needed for chest pain. 02/05/21   Theora Gianotti, NP      Allergies    Atorvastatin and Rosuvastatin    Review of Systems   Review of Systems  Constitutional:  Negative for fever.  Respiratory:  Positive for shortness of breath.   Cardiovascular:  Positive for chest pain.  Gastrointestinal:  Positive for diarrhea and nausea. Negative for abdominal pain and vomiting.  Neurological:  Negative for syncope and light-headedness.    Physical Exam Updated Vital Signs BP (!) 149/95   Pulse (!) 47   Temp 98.3 F (36.8 C)   Resp (!) 21   SpO2 99%  Physical Exam Vitals and nursing note reviewed.  Constitutional:      General: He is not in acute distress. HENT:     Head: Normocephalic and atraumatic.  Eyes:     Extraocular Movements: Extraocular movements intact.  Cardiovascular:     Rate and Rhythm: Normal rate and regular rhythm.     Heart sounds: Normal heart sounds.  Pulmonary:     Effort: Pulmonary effort is normal. No respiratory distress.     Breath sounds: Normal breath sounds.  Chest:     Chest wall: No tenderness.  Abdominal:     Palpations: Abdomen is soft.     Tenderness: There is no abdominal tenderness.  Musculoskeletal:        General: Normal range of motion.     Cervical back: Normal  range of motion and neck supple.     Right lower leg: No edema.     Left lower leg: No edema.  Skin:    General: Skin is warm and dry.  Neurological:     Mental Status: He is alert.     ED Results / Procedures / Treatments   Labs (all labs ordered are listed, but only abnormal results are displayed) Labs Reviewed  BASIC METABOLIC PANEL - Abnormal; Notable for the following components:      Result Value   CO2 20 (*)    Glucose, Bld 101 (*)    Creatinine, Ser 1.25 (*)    All other components within normal limits  CBC  TROPONIN I (HIGH SENSITIVITY)  TROPONIN I (HIGH SENSITIVITY)    EKG EKG Interpretation  Date/Time:  Sunday September 04 2021  10:44:56 EDT Ventricular Rate:  79 PR Interval:  156 QRS Duration: 94 QT Interval:  380 QTC Calculation: 435 R Axis:   -18 Text Interpretation: Normal sinus rhythm Minimal voltage criteria for LVH, may be normal variant ( R in aVL ) Borderline ECG Confirmed by Sherwood Gambler [phone]) on 09/04/2021 11:26:23 AM  Radiology DG Chest 2 View  Result Date: 09/04/2021 CLINICAL DATA:  Chest pain EXAM: CHEST - 2 VIEW COMPARISON:  02/03/2021 FINDINGS: The heart size and mediastinal contours are within normal limits. Both lungs are clear. The visualized skeletal structures are unremarkable. IMPRESSION: No active cardiopulmonary disease. Electronically Signed   By: Franchot Gallo M.D.   On: 09/04/2021 11:52    Procedures Procedures    Medications Ordered in ED Medications  aspirin tablet 325 mg (325 mg Oral Given 09/04/21 1155)    ED Course/ Medical Decision Making/ A&P Clinical Course as of 09/04/21 1438  Sun Sep 04, 2021  1216 Troponin I (High Sensitivity): 6 [LM]    Clinical Course User Index [LM] Ronny Bacon                           Medical Decision Making Amount and/or Complexity of Data Reviewed Labs: ordered. Decision-making details documented in ED Course. Radiology: ordered.  Risk OTC drugs.   This patient presents to the ED for concern of chest pain, this involves an extensive number of treatment options, and is a complaint that carries with it a high risk of complications and morbidity.  The differential diagnosis includes ACS, PE, dissection, pneumonia, GERD, and others   Co morbidities that complicate the patient evaluation  History of NSTEMI, CAD   Additional history obtained:   External records from outside source obtained and reviewed including discharge from NSTEMI   Lab Tests:  I Ordered, and personally interpreted labs.  The pertinent results include: Initial troponin 6, grossly normal CBC, creatinine 1.25 (consistent with previous results),  repeat troponin 4   Imaging Studies ordered:  I ordered imaging studies including chest x-ray I independently visualized and interpreted imaging which showed no active cardiopulmonary disease I agree with the radiologist interpretation   Cardiac Monitoring: / EKG:  The patient was maintained on a cardiac monitor.  I personally viewed and interpreted the cardiac monitored which showed an underlying rhythm of: Normal sinus rhythm  Problem List / ED Course / Critical interventions / Medication management  I ordered medication including aspirin for chest pain Reevaluation of the patient after these medicines showed that the patient improved I have reviewed the patients home medicines and have made adjustments as needed  Test / Admission - Considered:  The patient was offered both nitroglycerin and parenteral pain medication along with nausea medication.  Patient declined all stating he wanted to avoid meds.  Patient initially refused saline lock but I explained that it was important to have this in place in case his condition changes.  Patient's condition improved dramatically without medication.  Reevaluation found the patient completely without chest pain.  Patient does have diarrhea at this time.  Patient wanted to leave prior to drawing of second troponin.  I explained the importance of trending troponins to the patient and the patient reluctantly agreed to stay for second troponin.  Second troponin slight decrease from initial troponin, both of which were negligible.  Patient wants to discharge now and I feel this is reasonable.  Very low clinical suspicion at this time for ACS.  No current shortness of breath, tachycardia, chest pain to suggest PE.  No signs of dissection.  Unclear if this was stable angina, or more of a GI symptom at onset.  Patient now pain-free with stable vital signs.  Discharge home with recommendation for follow-up with both cardiology and  PCP.        Final Clinical Impression(s) / ED Diagnoses Final diagnoses:  Chest pain, unspecified type  Diarrhea, unspecified type    Rx / DC Orders ED Discharge Orders     None         Ronny Bacon 09/04/21 1438    Sherwood Gambler, MD 09/09/21 [phone]

## 2021-09-27 ENCOUNTER — Encounter: Payer: Self-pay | Admitting: Hematology and Oncology

## 2021-10-03 ENCOUNTER — Inpatient Hospital Stay: Payer: BC Managed Care – PPO | Attending: Oncology

## 2021-10-03 ENCOUNTER — Other Ambulatory Visit: Payer: BC Managed Care – PPO

## 2021-10-03 ENCOUNTER — Other Ambulatory Visit: Payer: Self-pay

## 2021-10-03 DIAGNOSIS — Z79899 Other long term (current) drug therapy: Secondary | ICD-10-CM | POA: Diagnosis not present

## 2021-10-03 DIAGNOSIS — C9210 Chronic myeloid leukemia, BCR/ABL-positive, not having achieved remission: Secondary | ICD-10-CM | POA: Insufficient documentation

## 2021-10-03 LAB — CBC AND DIFFERENTIAL
HCT: 41 (ref 41–53)
Hemoglobin: 14.1 (ref 13.5–17.5)
Neutrophils Absolute: 3.54
Platelets: 214 10*3/uL (ref 150–400)
WBC: 6.8

## 2021-10-03 LAB — CBC: RBC: 4.73 (ref 3.87–5.11)

## 2021-10-07 LAB — BCR-ABL1, CML/ALL, PCR, QUANT: Interpretation (BCRAL):: NEGATIVE

## 2021-10-10 ENCOUNTER — Ambulatory Visit: Payer: BC Managed Care – PPO | Admitting: Oncology

## 2021-10-10 ENCOUNTER — Encounter: Payer: Self-pay | Admitting: Hematology and Oncology

## 2021-10-10 ENCOUNTER — Inpatient Hospital Stay: Payer: BC Managed Care – PPO | Admitting: Hematology and Oncology

## 2021-10-10 DIAGNOSIS — C9210 Chronic myeloid leukemia, BCR/ABL-positive, not having achieved remission: Secondary | ICD-10-CM | POA: Diagnosis not present

## 2021-10-13 ENCOUNTER — Encounter: Payer: Self-pay | Admitting: Hematology and Oncology

## 2021-10-13 NOTE — Progress Notes (Cosign Needed)
Patient Care Team: Mckinley Jewel, MD as PCP - General (Internal Medicine) O'Neal, Cassie Freer, MD as PCP - Cardiology (Cardiology) Marice Potter, MD as Consulting Physician (Oncology)  Clinic Day:  10/13/2021  Referring physician: Mckinley Jewel, MD  ASSESSMENT & PLAN:   Assessment & Plan: CML (chronic myelocytic leukemia) ([place]) A 61 y.o. male with chronic myelogenous leukemia. His labs continue to show he is in a complete molecular response.  This reflects the efficacy of his dasatinib at 50 mg.  He will continue to take this medication at 50 mg daily. He is tolerating well with no issues. We discussed that we will have him return to clinic in 6 months for repeat evaluation and perhaps his visit after that can be a televisit. He is very interested in doing that.      The patient understands the plans discussed today and is in agreement with them.  He knows to contact our office if he develops concerns prior to his next appointment.   Melodye Ped, NP  [place] [address] [place] [address] Dept: [phone] Dept Fax: [phone]   No orders of the defined types were placed in this encounter.     CHIEF COMPLAINT:  CC: A 61 year old male with history of CML here for 6 month evaluation  Current Treatment:  Dasatinib  INTERVAL HISTORY:  Steven [name] is here today for repeat clinical assessment. He denies fevers or chills. He denies pain. His appetite is good. His weight has been stable.  I have reviewed the past medical history, past surgical history, social history and family history with the patient and they are unchanged from previous note.  ALLERGIES:  is allergic to atorvastatin and rosuvastatin.  MEDICATIONS:  Current Outpatient Medications  Medication Sig Dispense Refill   aspirin EC (BAYER ASPIRIN EC LOW DOSE) 81 MG tablet 1 tablet     benzonatate (TESSALON) 200 MG capsule 1  capsule     clopidogrel (PLAVIX) 75 MG tablet Take 1 tablet (75 mg total) by mouth daily. 90 tablet 3   colchicine 0.6 MG tablet Take 0.6 mg by mouth as needed.     dasatinib (SPRYCEL) 50 MG tablet 1 tablet     Evolocumab (REPATHA) 140 MG/ML SOSY Inject 1 ml     metoprolol tartrate (LOPRESSOR) 50 MG tablet Take 1 tablet (50 mg total) by mouth 2 (two) times daily. 180 tablet 1   nitroGLYCERIN (NITROSTAT) 0.4 MG SL tablet Place 1 tablet (0.4 mg total) under the tongue every 5 (five) minutes as needed for chest pain. 25 tablet 3   No current facility-administered medications for this visit.    HISTORY OF PRESENT ILLNESS:   Oncology History   No history exists.      REVIEW OF SYSTEMS:   Constitutional: Denies fevers, chills or abnormal weight loss Eyes: Denies blurriness of vision Ears, nose, mouth, throat, and face: Denies mucositis or sore throat Respiratory: Denies cough, dyspnea or wheezes Cardiovascular: Denies palpitation, chest discomfort or lower extremity swelling Gastrointestinal:  Denies nausea, heartburn or change in bowel habits Skin: Denies abnormal skin rashes Lymphatics: Denies new lymphadenopathy or easy bruising Neurological:Denies numbness, tingling or new weaknesses Behavioral/Psych: Mood is stable, no new changes  All other systems were reviewed with the patient and are negative.   VITALS:  Blood pressure 130/82, pulse 78, temperature (!) 97.5 F (36.4 C), temperature source Oral, resp. rate 18, height '5\' 11"'$  (1.803 m),  weight 221 lb 4.8 oz (100.4 kg), SpO2 95 %.  Wt Readings from Last 3 Encounters:  10/10/21 221 lb 4.8 oz (100.4 kg)  09/01/21 218 lb 9.6 oz (99.2 kg)  04/21/21 220 lb 3.2 oz (99.9 kg)    Body mass index is 30.87 kg/m.  Performance status (ECOG): 1 - Symptomatic but completely ambulatory  PHYSICAL EXAM:   GENERAL:alert, no distress and comfortable SKIN: skin color, texture, turgor are normal, no rashes or significant lesions EYES:  normal, Conjunctiva are pink and non-injected, sclera clear OROPHARYNX:no exudate, no erythema and lips, buccal mucosa, and tongue normal  NECK: supple, thyroid normal size, non-tender, without nodularity LYMPH:  no palpable lymphadenopathy in the cervical, axillary or inguinal LUNGS: clear to auscultation and percussion with normal breathing effort HEART: regular rate & rhythm and no murmurs and no lower extremity edema ABDOMEN:abdomen soft, non-tender and normal bowel sounds Musculoskeletal:no cyanosis of digits and no clubbing  NEURO: alert & oriented x 3 with fluent speech, no focal motor/sensory deficits  LABORATORY DATA:  I have reviewed the data as listed    Component Value Date/Time   NA 138 09/04/2021 1058   K 4.0 09/04/2021 1058   CL 110 09/04/2021 1058   CO2 20 (L) 09/04/2021 1058   GLUCOSE 101 (H) 09/04/2021 1058   BUN 15 09/04/2021 1058   CREATININE 1.25 (H) 09/04/2021 1058   CALCIUM 9.5 09/04/2021 1058   PROT 7.2 02/03/2021 1449   ALBUMIN 4.3 02/03/2021 1449   AST 48 (H) 02/03/2021 1449   ALT 34 02/03/2021 1449   ALKPHOS 64 02/03/2021 1449   BILITOT 1.4 (H) 02/03/2021 1449   GFRNONAA >60 09/04/2021 1058   GFRAA >60 06/26/2017 1022    No results found for: "SPEP", "UPEP"  Lab Results  Component Value Date   WBC 6.8 10/03/2021   NEUTROABS 3.54 10/03/2021   HGB 14.1 10/03/2021   HCT 41 10/03/2021   MCV 86.2 09/04/2021   PLT 214 10/03/2021      Chemistry      Component Value Date/Time   NA 138 09/04/2021 1058   K 4.0 09/04/2021 1058   CL 110 09/04/2021 1058   CO2 20 (L) 09/04/2021 1058   BUN 15 09/04/2021 1058   CREATININE 1.25 (H) 09/04/2021 1058      Component Value Date/Time   CALCIUM 9.5 09/04/2021 1058   ALKPHOS 64 02/03/2021 1449   AST 48 (H) 02/03/2021 1449   ALT 34 02/03/2021 1449   BILITOT 1.4 (H) 02/03/2021 1449       RADIOGRAPHIC STUDIES: I have personally reviewed the radiological images as listed and agreed with the findings in  the report. No results found.

## 2021-10-13 NOTE — Assessment & Plan Note (Signed)
A 61 y.o. male with chronic myelogenous leukemia. His labs continue to show he is in a complete molecular response.  This reflects the efficacy of his dasatinib at 50 mg.  He will continue to take this medication at 50 mg daily. He is tolerating well with no issues. We discussed that we will have him return to clinic in 6 months for repeat evaluation and perhaps his visit after that can be a televisit. He is very interested in doing that.

## 2021-12-26 ENCOUNTER — Encounter (HOSPITAL_COMMUNITY): Payer: Self-pay

## 2021-12-26 ENCOUNTER — Telehealth: Payer: Self-pay | Admitting: Cardiovascular Disease

## 2021-12-26 DIAGNOSIS — N200 Calculus of kidney: Secondary | ICD-10-CM | POA: Diagnosis not present

## 2021-12-26 DIAGNOSIS — R112 Nausea with vomiting, unspecified: Secondary | ICD-10-CM | POA: Diagnosis not present

## 2021-12-26 DIAGNOSIS — K760 Fatty (change of) liver, not elsewhere classified: Secondary | ICD-10-CM | POA: Diagnosis not present

## 2021-12-26 DIAGNOSIS — R9431 Abnormal electrocardiogram [ECG] [EKG]: Secondary | ICD-10-CM | POA: Diagnosis not present

## 2021-12-26 DIAGNOSIS — K922 Gastrointestinal hemorrhage, unspecified: Secondary | ICD-10-CM | POA: Diagnosis not present

## 2021-12-26 DIAGNOSIS — I2510 Atherosclerotic heart disease of native coronary artery without angina pectoris: Secondary | ICD-10-CM | POA: Diagnosis not present

## 2021-12-26 NOTE — Telephone Encounter (Signed)
Pt's Dad would like a callback regarding pt vomiting and having black stool since Saturday. Please advise

## 2021-12-26 NOTE — Telephone Encounter (Signed)
Spoke with patient who reported black diarrhea and black emesis since this past Saturday. He is currently in [facility] ED waiting for a bed at [facility] and for a colonoscopy. Advised patient to add the family members he wants staff to communicate with to the [facility]. He verbalized understanding.

## 2021-12-27 ENCOUNTER — Inpatient Hospital Stay (HOSPITAL_COMMUNITY): Payer: BC Managed Care – PPO

## 2021-12-27 ENCOUNTER — Other Ambulatory Visit: Payer: Self-pay

## 2021-12-27 ENCOUNTER — Encounter (HOSPITAL_COMMUNITY): Payer: Self-pay | Admitting: Internal Medicine

## 2021-12-27 ENCOUNTER — Inpatient Hospital Stay (HOSPITAL_COMMUNITY)
Admission: AD | Admit: 2021-12-27 | Discharge: 2021-12-29 | DRG: 368 | Disposition: A | Discharge status: Home / Self Care | Payer: BC Managed Care – PPO | Attending: Internal Medicine | Admitting: Internal Medicine

## 2021-12-27 DIAGNOSIS — Z6831 Body mass index (BMI) 31.0-31.9, adult: Secondary | ICD-10-CM

## 2021-12-27 DIAGNOSIS — E785 Hyperlipidemia, unspecified: Secondary | ICD-10-CM | POA: Diagnosis present

## 2021-12-27 DIAGNOSIS — I2510 Atherosclerotic heart disease of native coronary artery without angina pectoris: Secondary | ICD-10-CM | POA: Diagnosis present

## 2021-12-27 DIAGNOSIS — K920 Hematemesis: Secondary | ICD-10-CM | POA: Diagnosis not present

## 2021-12-27 DIAGNOSIS — R911 Solitary pulmonary nodule: Secondary | ICD-10-CM | POA: Diagnosis not present

## 2021-12-27 DIAGNOSIS — N182 Chronic kidney disease, stage 2 (mild): Secondary | ICD-10-CM | POA: Diagnosis present

## 2021-12-27 DIAGNOSIS — D649 Anemia, unspecified: Secondary | ICD-10-CM | POA: Diagnosis present

## 2021-12-27 DIAGNOSIS — C9210 Chronic myeloid leukemia, BCR/ABL-positive, not having achieved remission: Secondary | ICD-10-CM | POA: Diagnosis present

## 2021-12-27 DIAGNOSIS — K2211 Ulcer of esophagus with bleeding: Secondary | ICD-10-CM | POA: Diagnosis present

## 2021-12-27 DIAGNOSIS — I129 Hypertensive chronic kidney disease with stage 1 through stage 4 chronic kidney disease, or unspecified chronic kidney disease: Secondary | ICD-10-CM | POA: Diagnosis not present

## 2021-12-27 DIAGNOSIS — R112 Nausea with vomiting, unspecified: Secondary | ICD-10-CM | POA: Diagnosis not present

## 2021-12-27 DIAGNOSIS — Z86010 Personal history of colonic polyps: Secondary | ICD-10-CM | POA: Diagnosis not present

## 2021-12-27 DIAGNOSIS — E669 Obesity, unspecified: Secondary | ICD-10-CM | POA: Diagnosis present

## 2021-12-27 DIAGNOSIS — I252 Old myocardial infarction: Secondary | ICD-10-CM | POA: Diagnosis not present

## 2021-12-27 DIAGNOSIS — Z888 Allergy status to other drugs, medicaments and biological substances status: Secondary | ICD-10-CM | POA: Diagnosis not present

## 2021-12-27 DIAGNOSIS — K2101 Gastro-esophageal reflux disease with esophagitis, with bleeding: Principal | ICD-10-CM | POA: Diagnosis present

## 2021-12-27 DIAGNOSIS — K8020 Calculus of gallbladder without cholecystitis without obstruction: Secondary | ICD-10-CM | POA: Insufficient documentation

## 2021-12-27 DIAGNOSIS — Z79899 Other long term (current) drug therapy: Secondary | ICD-10-CM

## 2021-12-27 DIAGNOSIS — N179 Acute kidney failure, unspecified: Secondary | ICD-10-CM | POA: Diagnosis not present

## 2021-12-27 DIAGNOSIS — K921 Melena: Secondary | ICD-10-CM | POA: Diagnosis not present

## 2021-12-27 DIAGNOSIS — I10 Essential (primary) hypertension: Secondary | ICD-10-CM | POA: Diagnosis not present

## 2021-12-27 DIAGNOSIS — Z87891 Personal history of nicotine dependence: Secondary | ICD-10-CM | POA: Diagnosis not present

## 2021-12-27 DIAGNOSIS — R1011 Right upper quadrant pain: Secondary | ICD-10-CM | POA: Diagnosis not present

## 2021-12-27 DIAGNOSIS — I25118 Atherosclerotic heart disease of native coronary artery with other forms of angina pectoris: Secondary | ICD-10-CM | POA: Diagnosis not present

## 2021-12-27 DIAGNOSIS — K449 Diaphragmatic hernia without obstruction or gangrene: Secondary | ICD-10-CM | POA: Diagnosis not present

## 2021-12-27 DIAGNOSIS — Z7902 Long term (current) use of antithrombotics/antiplatelets: Secondary | ICD-10-CM | POA: Diagnosis not present

## 2021-12-27 DIAGNOSIS — K922 Gastrointestinal hemorrhage, unspecified: Secondary | ICD-10-CM | POA: Diagnosis present

## 2021-12-27 DIAGNOSIS — Z8249 Family history of ischemic heart disease and other diseases of the circulatory system: Secondary | ICD-10-CM | POA: Diagnosis not present

## 2021-12-27 LAB — CBC WITH DIFFERENTIAL/PLATELET
Abs Immature Granulocytes: 0.02 10*3/uL (ref 0.00–0.07)
Basophils Absolute: 0 10*3/uL (ref 0.0–0.1)
Basophils Relative: 0 %
Eosinophils Absolute: 0.6 10*3/uL — ABNORMAL HIGH (ref 0.0–0.5)
Eosinophils Relative: 8 %
HCT: 31.1 % — ABNORMAL LOW (ref 39.0–52.0)
Hemoglobin: 10.6 g/dL — ABNORMAL LOW (ref 13.0–17.0)
Immature Granulocytes: 0 %
Lymphocytes Relative: 20 %
Lymphs Abs: 1.4 10*3/uL (ref 0.7–4.0)
MCH: 30.3 pg (ref 26.0–34.0)
MCHC: 34.1 g/dL (ref 30.0–36.0)
MCV: 88.9 fL (ref 80.0–100.0)
Monocytes Absolute: 0.4 10*3/uL (ref 0.1–1.0)
Monocytes Relative: 5 %
Neutro Abs: 4.6 10*3/uL (ref 1.7–7.7)
Neutrophils Relative %: 67 %
Platelets: 200 10*3/uL (ref 150–400)
RBC: 3.5 MIL/uL — ABNORMAL LOW (ref 4.22–5.81)
RDW: 15.2 % (ref 11.5–15.5)
WBC: 7 10*3/uL (ref 4.0–10.5)
nRBC: 0 % (ref 0.0–0.2)

## 2021-12-27 LAB — COMPREHENSIVE METABOLIC PANEL
ALT: 19 U/L (ref 0–44)
AST: 16 U/L (ref 15–41)
Albumin: 3.6 g/dL (ref 3.5–5.0)
Alkaline Phosphatase: 43 U/L (ref 38–126)
Anion gap: 5 (ref 5–15)
BUN: 34 mg/dL — ABNORMAL HIGH (ref 8–23)
CO2: 24 mmol/L (ref 22–32)
Calcium: 9 mg/dL (ref 8.9–10.3)
Chloride: 114 mmol/L — ABNORMAL HIGH (ref 98–111)
Creatinine, Ser: 1.33 mg/dL — ABNORMAL HIGH (ref 0.61–1.24)
GFR, Estimated: >60 mL/min (ref >60)
Glucose, Bld: 97 mg/dL (ref 70–99)
Potassium: 4.4 mmol/L (ref 3.5–5.1)
Sodium: 143 mmol/L (ref 135–145)
Total Bilirubin: 0.4 mg/dL (ref 0.3–1.2)
Total Protein: 6.1 g/dL — ABNORMAL LOW (ref 6.5–8.1)

## 2021-12-27 LAB — PROTIME-INR
INR: 1.1 (ref 0.8–1.2)
Prothrombin Time: 14.2 seconds (ref 11.4–15.2)

## 2021-12-27 LAB — TYPE AND SCREEN
ABO/RH(D): O POS
Antibody Screen: NEGATIVE

## 2021-12-27 LAB — RETICULOCYTES
Immature Retic Fract: 16.9 % — ABNORMAL HIGH (ref 2.3–15.9)
RBC.: 3.56 MIL/uL — ABNORMAL LOW (ref 4.22–5.81)
Retic Count, Absolute: 93.3 10*3/uL (ref 19.0–186.0)
Retic Ct Pct: 2.6 % (ref 0.4–3.1)

## 2021-12-27 LAB — APTT: aPTT: 26 seconds (ref 24–36)

## 2021-12-27 MED ORDER — ACETAMINOPHEN 650 MG RE SUPP: 650.0000 mg | Freq: Four times a day (QID) | RECTAL | Status: DC | PRN

## 2021-12-27 MED ORDER — ONDANSETRON HCL 4 MG/2ML IJ SOLN: 4.0000 mg | Freq: Four times a day (QID) | INTRAMUSCULAR | Status: DC | PRN

## 2021-12-27 MED ORDER — ACETAMINOPHEN 325 MG PO TABS: 650.0000 mg | ORAL_TABLET | Freq: Four times a day (QID) | ORAL | Status: DC | PRN

## 2021-12-27 MED ORDER — SODIUM CHLORIDE 0.9 % IV SOLN: INTRAVENOUS | Status: DC

## 2021-12-27 MED ORDER — METOPROLOL TARTRATE 25 MG PO TABS
25.0000 mg | ORAL_TABLET | Freq: Two times a day (BID) | ORAL | Status: DC
  Administered 2021-12-27 – 2021-12-28 (×4): 25 mg via ORAL
  Filled 2021-12-27 (×4): qty 1

## 2021-12-27 MED ORDER — ONDANSETRON HCL 4 MG PO TABS: 4.0000 mg | ORAL_TABLET | Freq: Four times a day (QID) | ORAL | Status: DC | PRN

## 2021-12-27 MED ORDER — PANTOPRAZOLE SODIUM 40 MG IV SOLR
40.0000 mg | Freq: Two times a day (BID) | INTRAVENOUS | Status: AC
  Administered 2021-12-27 – 2021-12-28 (×4): 40 mg via INTRAVENOUS
  Filled 2021-12-27 (×4): qty 10

## 2021-12-27 NOTE — Plan of Care (Signed)

## 2021-12-27 NOTE — H&P (View-Only) (Signed)
Referring Provider: [facility] Primary Care Physician:  Mckinley Jewel, MD Primary Gastroenterologist:  Althia Forts  Reason for Consultation:  Coffee ground emesis  HPI: Steven [name] is a 61 y.o. male with past medical history of CKD stage II, chronic myeloid leukemia followed at [facility], CAD, MI in 2022 now on Plavix.  Patient initially presented to [facility] with complaints of fatigue, melena, diarrhea, coffee-ground emesis for 2 days.   He reports starting on Saturday afternoon he began to have generalized abdominal pain, nausea, vomiting characterizes coffee-ground emesis, and diarrhea which was black in color.  Notes he is having a bowel movement every 15 to 20 minutes on Saturday and Sunday.  He had 4 episodes of emesis between Saturday and Sunday.  Due to concern for dehydration he decided to present to [facility].   At [facility], CT abdomen with no acute findings, normal LFTs, INR 1, hemoglobin 13.3, WBC 13.2.  Patient received IV Protonix 80 mg.   He was transferred to [facility] for GI bleed.  Patient notes he recently had Cologuard in 2023, prior to that he had a colonoscopy 5 to 7 years ago, notes he had some polyps removed but is unsure .  Reports he also had an EGD at the same time as his colonoscopy, does not remember the findings.  No records on care everywhere, reports procedures in [place].  Reports he took some Kaopectate after he began having vomiting but his black stools and vomiting predated his Kaopectate use.  Denies iron pills.  Denies NSAID use.  Denies alcohol use.  Last dose of Plavix was Sunday evening.   Past Medical History:  Diagnosis Date   Chest pain    a. Cath 2007:  luminal irregs, normal LVF (Dr. Doylene Canard) - was placed on plavix afterwards.;   b.  ETT-MV 11/13:   mild inferior ischemia, EF 59%.   CKD (chronic kidney disease), stage II    CML (chronic myeloid leukemia) ([place])    a. Dx 2011 - followed @  [facility] - remission   Hepatitis    IN THE 1980'S   Hyperlipidemia    a. Dx 2006   Hypertension    a. Dx 2006   Pneumonia    hx of PNA    Past Surgical History:  Procedure Laterality Date   CARDIAC CATHETERIZATION  02/16/2012   30% tubular prox RCA stenosis, normal coronaries otherwise; LVEF 65%, no WMAs   CORONARY/GRAFT ACUTE MI REVASCULARIZATION N/A 02/03/2021   Procedure: Coronary/Graft Acute MI Revascularization;  Surgeon: Burnell Blanks, MD;  Location: [place] CV LAB;  Service: Cardiovascular;  Laterality: N/A;   LEFT HEART CATH AND CORONARY ANGIOGRAPHY N/A 02/03/2021   Procedure: LEFT HEART CATH AND CORONARY ANGIOGRAPHY;  Surgeon: Burnell Blanks, MD;  Location: [place] CV LAB;  Service: Cardiovascular;  Laterality: N/A;   LEFT HEART CATHETERIZATION WITH CORONARY ANGIOGRAM N/A 02/16/2012   Procedure: LEFT HEART CATHETERIZATION WITH CORONARY ANGIOGRAM;  Surgeon: Josue Hector, MD;  Location[facility] C[facility] LAB;  Service: Cardiovascular;  Laterality: N/A;   Left Rotator Cuff Repair     a. approx. 2003    Prior to Admission medications   Medication Sig Start Date End Date Taking? Authorizing Provider  aspirin EC (BAYER ASPIRIN EC LOW DOSE) 81 MG tablet 1 tablet    [provider]  benzonatate (TESSALON) 200 MG capsule 1 capsule 07/08/21   [provider]  clopidogrel (PLAVIX) 75 MG tablet Take 1  tablet (75 mg total) by mouth daily. 09/01/21   O'NealCassie Freer, MD  colchicine 0.6 MG tablet Take 0.6 mg by mouth as needed.    [provider]  dasatinib (SPRYCEL) 50 MG tablet 1 tablet    [provider]  Evolocumab (REPATHA) 140 MG/ML SOSY Inject 1 ml    [provider]  metoprolol tartrate (LOPRESSOR) 50 MG tablet Take 1 tablet (50 mg total) by mouth 2 (two) times daily. 09/01/21   O'Neal, Cassie Freer, MD  nitroGLYCERIN (NITROSTAT) 0.4 MG SL tablet Place 1 tablet (0.4 mg total) under the tongue every 5  (five) minutes as needed for chest pain. 02/05/21   Theora Gianotti, NP    Scheduled Meds:  metoprolol tartrate  25 mg Oral BID   pantoprazole (PROTONIX) IV  40 mg Intravenous Q12H   Continuous Infusions:  sodium chloride     PRN Meds:.acetaminophen **OR** acetaminophen, ondansetron **OR** ondansetron (ZOFRAN) IV  Allergies as of 12/26/2021 - Review Complete 10/13/2021  Allergen Reaction Noted   Atorvastatin Other (See Comments) 04/19/2021   Rosuvastatin Other (See Comments) 04/19/2021    Family History  Problem Relation Age of Onset   Heart attack Father        Alive @ 9 (stenting @ 60)   Other Mother        Alive & well @ 52   Other Brother        Alive @ 21    Social History   Socioeconomic History   Marital status: Divorced    Spouse name: Not on file   Number of children: Not on file   Years of education: Not on file   Highest education level: Not on file  Occupational History   Not on file  Tobacco Use   Smoking status: Former    Packs/day: 1.00    Years: 30.00    Total pack years: 30.00    Types: Cigarettes    Quit date: 02/14/2005    Years since quitting: 16.8   Smokeless tobacco: Never   Tobacco comments:    smoked 1-1/12 ppd for better part of 30 years  Substance and Sexual Activity   Alcohol use: Yes    Comment: Rare drink   Drug use: No   Sexual activity: Not on file  Other Topics Concern   Not on file  Social History Narrative   Lives in [place] by himself.  Works as an Clinical biochemist.   Social Determinants of Health   Financial Resource Strain: Not on file  Food Insecurity: Not on file  Transportation Needs: Not on file  Physical Activity: Not on file  Stress: Not on file  Social Connections: Not on file  Intimate Partner Violence: Not on file    Review of Systems: All negative except as stated above in HPI.  Physical Exam:Physical Exam Constitutional:      General: He is not in acute distress.    Appearance: He is  normal weight.  HENT:     Head: Normocephalic and atraumatic.     Right Ear: External ear normal.     Left Ear: External ear normal.     Nose: Nose normal.     Mouth/Throat:     Mouth: Mucous membranes are moist.  Eyes:     Pupils: Pupils are equal, round, and reactive to light.  Cardiovascular:     Rate and Rhythm: Normal rate and regular rhythm.     Pulses: Normal pulses.  Heart sounds: Normal heart sounds.  Pulmonary:     Effort: Pulmonary effort is normal.     Breath sounds: Normal breath sounds.  Abdominal:     General: Bowel sounds are normal. There is no distension.     Palpations: Abdomen is soft. There is no mass.     Tenderness: There is abdominal tenderness (generalized). There is no guarding or rebound.     Hernia: No hernia is present.  Musculoskeletal:        General: Normal range of motion.     Cervical back: Normal range of motion and neck supple.  Skin:    General: Skin is warm and dry.  Neurological:     General: No focal deficit present.     Mental Status: He is alert and oriented to person, place, and time. Mental status is at baseline.  Psychiatric:        Mood and Affect: Mood normal.        Behavior: Behavior normal.     Vital signs: Vitals:   12/27/21 0923  BP: (!) 144/87  Pulse: 88  Resp: 18  Temp: 97.7 F (36.5 C)        GI:  Lab Results: No results for input(s): "WBC", "HGB", "HCT", "PLT" in the last 72 hours. BMET No results for input(s): "NA", "K", "CL", "CO2", "GLUCOSE", "BUN", "CREATININE", "CALCIUM" in the last 72 hours. LFT No results for input(s): "PROT", "ALBUMIN", "AST", "ALT", "ALKPHOS", "BILITOT", "BILIDIR", "IBILI" in the last 72 hours. PT/INR No results for input(s): "LABPROT", "INR" in the last 72 hours.   Studies/Results: No results found.  Impression: Abdominal pain Coffee-ground emesis Melena Nausea Diarrhea  Patient with abdominal pain, nausea, coffee-ground emesis, melena since Saturday afternoon.   He was initially seen at [facility] but transferred for GI bleed and need for endoscopy.  No previous NSAID use, no alcohol use.  Last dose of Plavix Sunday evening.  Awaiting labs CMP, CBC, PT and INR for evaluation of upper GI bleed.  Hemoglobin 13.3 at [facility].  With coffee-ground emesis and melena suspect patient may have upper GI bleed, will due to ulcer, gastritis, H. Pylori.   Plan: Plan for EGD tomorrow. I thoroughly discussed the procedures to include nature, alternatives, benefits, and risks including but not limited to bleeding, perforation, infection, anesthesia/cardiac and pulmonary complications. Patient provides understanding and gave verbal consent to proceed. Continue Protonix 40 mg bid Continue clear liquid diet NPO at midnight Continue anti-emetics and supportive care as needed. [facility] will follow.     LOS: 0 days   Charlott Rakes  PA-C 12/27/2021, 10:30 AM  Contact #  [phone]

## 2021-12-27 NOTE — H&P (Signed)
History and Physical  Patient: Steven [name] ZOX:[id number] DOB: 06-29-1960 DOA: 12/27/2021 DOS: the patient was seen and examined on 12/27/2021 Patient coming from: Home  Chief Complaint: Nausea and black-colored diarrhea.  HPI: Steven [name] is a 61 y.o. 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  Patient presented to the hospital with complaints of multiple episodes of diarrhea followed by nausea starting Saturday on 12/24/2021.  Patient initially presented to [facility] ER with this complaint on Sunday night where he was found to have GI bleed with hemoglobin drop from 14-13. Patient reports multiple black-colored bowel movements every 15 to 20 minutes medium size mixed with mucus.  No BRBPR reported. Along with that the patient also reports right upper quadrant pain which is crampy and sharp in nature which appears to be improving. Patient reports nausea and a few episodes of vomiting without any blood.  But does report black color vomiting. Reportedly he took some Kaopectate after having GI symptoms. No fever no chills.  No recent change in the medications. He has been on Sprycel for years.  Denies taking any NSAIDs.  No alcohol abuse. Patient is also on aspirin and Plavix and has been on DAPT since November 2022. Currently denies any chest pain shortness of breath no dizziness or lightheadedness.  No nausea. Also denies any further episodes of diarrhea today. Patient reportedly was supposed to have his gallbladder removed in 2022 and was seeing a surgeon there although due to his MI currently his gallbladder surgery is postponed.  Patient denies any frequent right upper quadrant pain with other than this particular episode since his MI.  Course in the [facility] ER. Blood pressure 120/79.  Saturating 97% on room air.  EKG sinus tachycardia CT abdomen no acute findings. 4 mm right middle lobe pulmonary nodule. Lactic acid 1.4. Troponin  0.05 Sodium normal. Potassium 3.8, chloride 107, CO2 21, creatinine 1.60. Normal LFTs. INR 1. Hemoglobin 13.3. WBC 13.2. Platelet 304. Received IV Protonix 80x1 and started on Protonix drip.   Review of Systems: As mentioned in the history of present illness. All other systems reviewed and are negative. Past Medical History:  Diagnosis Date   Chest pain    a. Cath 2007:  luminal irregs, normal LVF (Dr. Doylene Canard) - was placed on plavix afterwards.;   b.  ETT-MV 11/13:   mild inferior ischemia, EF 59%.   CKD (chronic kidney disease), stage II    CML (chronic myeloid leukemia) ([place])    a. Dx 2011 - followed @ [facility] - remission   Hepatitis    IN THE 1980'S   Hyperlipidemia    a. Dx 2006   Hypertension    a. Dx 2006   Pneumonia    hx of PNA   Past Surgical History:  Procedure Laterality Date   CARDIAC CATHETERIZATION  02/16/2012   30% tubular prox RCA stenosis, normal coronaries otherwise; LVEF 65%, no WMAs   CORONARY/GRAFT ACUTE MI REVASCULARIZATION N/A 02/03/2021   Procedure: Coronary/Graft Acute MI Revascularization;  Surgeon: Burnell Blanks, MD;  Location: [place] CV LAB;  Service: Cardiovascular;  Laterality: N/A;   LEFT HEART CATH AND CORONARY ANGIOGRAPHY N/A 02/03/2021   Procedure: LEFT HEART CATH AND CORONARY ANGIOGRAPHY;  Surgeon: Burnell Blanks, MD;  Location: [place] CV LAB;  Service: Cardiovascular;  Laterality: N/A;   LEFT HEART CATHETERIZATION WITH CORONARY ANGIOGRAM N/A 02/16/2012   Procedure: LEFT HEART CATHETERIZATION WITH CORONARY ANGIOGRAM;  Surgeon: Collier [place]  Tommy Rainwater, MD;  Location: [place] CATH LAB;  Service: Cardiovascular;  Laterality: N/A;   Left Rotator Cuff Repair     a. approx. 2003   Social History:  reports that he quit smoking about 16 years ago. His smoking use included cigarettes. He has a 30.00 pack-year smoking history. He has never used smokeless tobacco. He reports current alcohol use. He reports that he does not  use drugs. Allergies  Allergen Reactions   Atorvastatin Other (See Comments)    myalgias   Rosuvastatin Other (See Comments)    myalgias   Family History  Problem Relation Age of Onset   Heart attack Father        Alive @ 48 (stenting @ 23)   Other Mother        Alive & well @ 20   Other Brother        Alive @ 77   Prior to Admission medications   Medication Sig Start Date End Date Taking? Authorizing Provider  aspirin EC (BAYER ASPIRIN EC LOW DOSE) 81 MG tablet 1 tablet    [provider]  benzonatate (TESSALON) 200 MG capsule 1 capsule 07/08/21   [provider]  clopidogrel (PLAVIX) 75 MG tablet Take 1 tablet (75 mg total) by mouth daily. 09/01/21   O'NealCassie Freer, MD  colchicine 0.6 MG tablet Take 0.6 mg by mouth as needed.    [provider]  dasatinib (SPRYCEL) 50 MG tablet 1 tablet    [provider]  Evolocumab (REPATHA) 140 MG/ML SOSY Inject 1 ml    [provider]  metoprolol tartrate (LOPRESSOR) 50 MG tablet Take 1 tablet (50 mg total) by mouth 2 (two) times daily. 09/01/21   O'Neal, Cassie Freer, MD  nitroGLYCERIN (NITROSTAT) 0.4 MG SL tablet Place 1 tablet (0.4 mg total) under the tongue every 5 (five) minutes as needed for chest pain. 02/05/21   Theora Gianotti, NP   Physical Exam: Vitals:   12/27/21 0923  BP: (!) 144/87  Pulse: 88  Resp: 18  Temp: 97.7 F (36.5 C)  TempSrc: Oral   General: Appear in mild distress; no visible Abnormal Neck Mass Or lumps, Conjunctiva normal Cardiovascular: S1 and S2 Present, no Murmur, Respiratory: good respiratory effort, Bilateral Air entry present and CTA, no Crackles, no wheezes Abdomen: Bowel Sound present, right upper quadrant tenderness, no guarding or rigidity Extremities: no Pedal edema Neurology: alert and oriented to time, place, and person Gait not checked due to patient safety concerns   Data Reviewed: I have Reviewed nursing notes, Vitals, and Lab  results since pt's last encounter. Pertinent lab results CBC and BMP and INR I have ordered test including CBC and CMP I have ordered imaging studies right upper quadrant ultrasound. I have discussed pt's care plan and test results with GI.    Assessment and Plan GI bleed Hemoglobin at baseline around 15.  Dropped to 13 at [facility] ER and currently 10.6. For now we will continue to monitor CBC every 12 hours. GI consulted. No active bleeding reported by the patient anymore but did have some melena. Check FOBT. Transfuse for hemoglobin less than 8 due to his history of CAD. GI currently scheduling patient for EGD on 10/18. Continue PPI twice daily IV. Clear liquid diet for today and n.p.o. after midnight.  History of gallstones Right upper quadrant tenderness. We will check right quadrant ultrasound. CT scan negative for any acute abnormality. Patient does not have any symptoms of biliary colic other  than this abdominal pain.  CAD (coronary artery disease), native coronary artery On dual inhaler therapy. Last cardiac catheterization November 2022 with PTCA/DES x 1 mid Circumflex and PTCA/DES x 1 mid LAD.  Patient was recommended to be on DAPT for 12 months. On aspirin and Plavix as unable to tolerate Brilinta due to shortness of breath. Currently chest pain-free. Transfer hemoglobin less than 8.  Hyperlipidemia Under outpatient. Monitor.  Essential hypertension Blood pressure stable. On metoprolol 50 mg twice daily. We will initiate at 25 mg twice daily with holding parameters.  CML (chronic myelocytic leukemia) ([place]) On Sprycel. 2 to 9% of the population on Sprycel do experience GI hemorrhage. Monitor.  AKI on CKD (chronic kidney disease), stage II Continue monitoring renal function. Prior serum creatinine 1.25 current serum creatinine 1.33. On arrival to [facility] ER serum creatinine 1.60.  Meeting AKI criteria.  Obesity (BMI 30-39.9) Monitor.  Advance Care  Planning:   Code Status: Full Code  Consults: [facility] Communication: Family at bedside  Author: Berle Mull, MD 12/27/2021 9:48 AM For on call review [email or web address].

## 2021-12-27 NOTE — Consult Note (Addendum)
Referring Provider: [facility] Primary Care Physician:  Mckinley Jewel, MD Primary Gastroenterologist:  Althia Forts  Reason for Consultation:  Coffee ground emesis  HPI: Steven [name] is a 61 y.o. male with past medical history of CKD stage II, chronic myeloid leukemia followed at [facility], CAD, MI in 2022 now on Plavix.  Patient initially presented to [facility] with complaints of fatigue, melena, diarrhea, coffee-ground emesis for 2 days.   He reports starting on Saturday afternoon he began to have generalized abdominal pain, nausea, vomiting characterizes coffee-ground emesis, and diarrhea which was black in color.  Notes he is having a bowel movement every 15 to 20 minutes on Saturday and Sunday.  He had 4 episodes of emesis between Saturday and Sunday.  Due to concern for dehydration he decided to present to [facility].   At [facility], CT abdomen with no acute findings, normal LFTs, INR 1, hemoglobin 13.3, WBC 13.2.  Patient received IV Protonix 80 mg.   He was transferred to [facility] for GI bleed.  Patient notes he recently had Cologuard in 2023, prior to that he had a colonoscopy 5 to 7 years ago, notes he had some polyps removed but is unsure .  Reports he also had an EGD at the same time as his colonoscopy, does not remember the findings.  No records on care everywhere, reports procedures in [place].  Reports he took some Kaopectate after he began having vomiting but his black stools and vomiting predated his Kaopectate use.  Denies iron pills.  Denies NSAID use.  Denies alcohol use.  Last dose of Plavix was Sunday evening.   Past Medical History:  Diagnosis Date   Chest pain    a. Cath 2007:  luminal irregs, normal LVF (Dr. Doylene Canard) - was placed on plavix afterwards.;   b.  ETT-MV 11/13:   mild inferior ischemia, EF 59%.   CKD (chronic kidney disease), stage II    CML (chronic myeloid leukemia) ([place])    a. Dx 2011 - followed @  [facility] - remission   Hepatitis    IN THE 1980'S   Hyperlipidemia    a. Dx 2006   Hypertension    a. Dx 2006   Pneumonia    hx of PNA    Past Surgical History:  Procedure Laterality Date   CARDIAC CATHETERIZATION  02/16/2012   30% tubular prox RCA stenosis, normal coronaries otherwise; LVEF 65%, no WMAs   CORONARY/GRAFT ACUTE MI REVASCULARIZATION N/A 02/03/2021   Procedure: Coronary/Graft Acute MI Revascularization;  Surgeon: Burnell Blanks, MD;  Location: [place] CV LAB;  Service: Cardiovascular;  Laterality: N/A;   LEFT HEART CATH AND CORONARY ANGIOGRAPHY N/A 02/03/2021   Procedure: LEFT HEART CATH AND CORONARY ANGIOGRAPHY;  Surgeon: Burnell Blanks, MD;  Location: [place] CV LAB;  Service: Cardiovascular;  Laterality: N/A;   LEFT HEART CATHETERIZATION WITH CORONARY ANGIOGRAM N/A 02/16/2012   Procedure: LEFT HEART CATHETERIZATION WITH CORONARY ANGIOGRAM;  Surgeon: Josue Hector, MD;  Location: [facility] CATH LAB;  Service: Cardiovascular;  Laterality: N/A;   Left Rotator Cuff Repair     a. approx. 2003    Prior to Admission medications   Medication Sig Start Date End Date Taking? Authorizing Provider  aspirin EC (BAYER ASPIRIN EC LOW DOSE) 81 MG tablet 1 tablet    [provider]  benzonatate (TESSALON) 200 MG capsule 1 capsule 07/08/21   [provider]  clopidogrel (PLAVIX) 75 MG tablet Take 1  tablet (75 mg total) by mouth daily. 09/01/21   O'NealCassie Freer, MD  colchicine 0.6 MG tablet Take 0.6 mg by mouth as needed.    [provider]  dasatinib (SPRYCEL) 50 MG tablet 1 tablet    [provider]  Evolocumab (REPATHA) 140 MG/ML SOSY Inject 1 ml    [provider]  metoprolol tartrate (LOPRESSOR) 50 MG tablet Take 1 tablet (50 mg total) by mouth 2 (two) times daily. 09/01/21   O'Neal, Cassie Freer, MD  nitroGLYCERIN (NITROSTAT) 0.4 MG SL tablet Place 1 tablet (0.4 mg total) under the tongue every 5  (five) minutes as needed for chest pain. 02/05/21   Theora Gianotti, NP    Scheduled Meds:  metoprolol tartrate  25 mg Oral BID   pantoprazole (PROTONIX) IV  40 mg Intravenous Q12H   Continuous Infusions:  sodium chloride     PRN Meds:.acetaminophen **OR** acetaminophen, ondansetron **OR** ondansetron (ZOFRAN) IV  Allergies as of 12/26/2021 - Review Complete 10/13/2021  Allergen Reaction Noted   Atorvastatin Other (See Comments) 04/19/2021   Rosuvastatin Other (See Comments) 04/19/2021    Family History  Problem Relation Age of Onset   Heart attack Father        Alive @ 60 (stenting @ 14)   Other Mother        Alive & well @ 67   Other Brother        Alive @ 8    Social History   Socioeconomic History   Marital status: Divorced    Spouse name: Not on file   Number of children: Not on file   Years of education: Not on file   Highest education level: Not on file  Occupational History   Not on file  Tobacco Use   Smoking status: Former    Packs/day: 1.00    Years: 30.00    Total pack years: 30.00    Types: Cigarettes    Quit date: 02/14/2005    Years since quitting: 16.8   Smokeless tobacco: Never   Tobacco comments:    smoked 1-1/12 ppd for better part of 30 years  Substance and Sexual Activity   Alcohol use: Yes    Comment: Rare drink   Drug use: No   Sexual activity: Not on file  Other Topics Concern   Not on file  Social History Narrative   Lives in [place] by himself.  Works as an Clinical biochemist.   Social Determinants of Health   Financial Resource Strain: Not on file  Food Insecurity: Not on file  Transportation Needs: Not on file  Physical Activity: Not on file  Stress: Not on file  Social Connections: Not on file  Intimate Partner Violence: Not on file    Review of Systems: All negative except as stated above in HPI.  Physical Exam:Physical Exam Constitutional:      General: He is not in acute distress.    Appearance: He is  normal weight.  HENT:     Head: Normocephalic and atraumatic.     Right Ear: External ear normal.     Left Ear: External ear normal.     Nose: Nose normal.     Mouth/Throat:     Mouth: Mucous membranes are moist.  Eyes:     Pupils: Pupils are equal, round, and reactive to light.  Cardiovascular:     Rate and Rhythm: Normal rate and regular rhythm.     Pulses: Normal pulses.  Heart sounds: Normal heart sounds.  Pulmonary:     Effort: Pulmonary effort is normal.     Breath sounds: Normal breath sounds.  Abdominal:     General: Bowel sounds are normal. There is no distension.     Palpations: Abdomen is soft. There is no mass.     Tenderness: There is abdominal tenderness (generalized). There is no guarding or rebound.     Hernia: No hernia is present.  Musculoskeletal:        General: Normal range of motion.     Cervical back: Normal range of motion and neck supple.  Skin:    General: Skin is warm and dry.  Neurological:     General: No focal deficit present.     Mental Status: He is alert and oriented to person, place, and time. Mental status is at baseline.  Psychiatric:        Mood and Affect: Mood normal.        Behavior: Behavior normal.     Vital signs: Vitals:   12/27/21 0923  BP: (!) 144/87  Pulse: 88  Resp: 18  Temp: 97.7 F (36.5 C)        GI:  Lab Results: No results for input(s): "WBC", "HGB", "HCT", "PLT" in the last 72 hours. BMET No results for input(s): "NA", "K", "CL", "CO2", "GLUCOSE", "BUN", "CREATININE", "CALCIUM" in the last 72 hours. LFT No results for input(s): "PROT", "ALBUMIN", "AST", "ALT", "ALKPHOS", "BILITOT", "BILIDIR", "IBILI" in the last 72 hours. PT/INR No results for input(s): "LABPROT", "INR" in the last 72 hours.   Studies/Results: No results found.  Impression: Abdominal pain Coffee-ground emesis Melena Nausea Diarrhea  Patient with abdominal pain, nausea, coffee-ground emesis, melena since Saturday afternoon.   He was initially seen at [facility] but transferred for GI bleed and need for endoscopy.  No previous NSAID use, no alcohol use.  Last dose of Plavix Sunday evening.  Awaiting labs CMP, CBC, PT and INR for evaluation of upper GI bleed.  Hemoglobin 13.3 at [facility].  With coffee-ground emesis and melena suspect patient may have upper GI bleed, will due to ulcer, gastritis, H. Pylori.   Plan: Plan for EGD tomorrow. I thoroughly discussed the procedures to include nature, alternatives, benefits, and risks including but not limited to bleeding, perforation, infection, anesthesia/cardiac and pulmonary complications. Patient provides understanding and gave verbal consent to proceed. Continue Protonix 40 mg bid Continue clear liquid diet NPO at midnight Continue anti-emetics and supportive care as needed. [facility] will follow.     LOS: 0 days   Charlott Rakes  PA-C 12/27/2021, 10:30 AM  Contact #  [phone]

## 2021-12-28 ENCOUNTER — Inpatient Hospital Stay (HOSPITAL_COMMUNITY): Payer: BC Managed Care – PPO | Admitting: Anesthesiology

## 2021-12-28 ENCOUNTER — Encounter (HOSPITAL_COMMUNITY)
Admission: AD | Disposition: A | Discharge status: Home / Self Care | Payer: Self-pay | Source: Home / Self Care | Attending: Internal Medicine

## 2021-12-28 ENCOUNTER — Encounter (HOSPITAL_COMMUNITY): Payer: Self-pay | Admitting: Internal Medicine

## 2021-12-28 DIAGNOSIS — K922 Gastrointestinal hemorrhage, unspecified: Secondary | ICD-10-CM | POA: Diagnosis not present

## 2021-12-28 DIAGNOSIS — K2101 Gastro-esophageal reflux disease with esophagitis, with bleeding: Secondary | ICD-10-CM | POA: Diagnosis present

## 2021-12-28 DIAGNOSIS — C9210 Chronic myeloid leukemia, BCR/ABL-positive, not having achieved remission: Secondary | ICD-10-CM | POA: Diagnosis not present

## 2021-12-28 DIAGNOSIS — I10 Essential (primary) hypertension: Secondary | ICD-10-CM | POA: Diagnosis not present

## 2021-12-28 DIAGNOSIS — I25118 Atherosclerotic heart disease of native coronary artery with other forms of angina pectoris: Secondary | ICD-10-CM | POA: Diagnosis not present

## 2021-12-28 HISTORY — PX: ESOPHAGOGASTRODUODENOSCOPY: SHX5428

## 2021-12-28 HISTORY — PX: BIOPSY: SHX5522

## 2021-12-28 LAB — CBC
HCT: 29.3 % — ABNORMAL LOW (ref 39.0–52.0)
Hemoglobin: 9.8 g/dL — ABNORMAL LOW (ref 13.0–17.0)
MCH: 30.2 pg (ref 26.0–34.0)
MCHC: 33.4 g/dL (ref 30.0–36.0)
MCV: 90.2 fL (ref 80.0–100.0)
Platelets: 197 10*3/uL (ref 150–400)
RBC: 3.25 MIL/uL — ABNORMAL LOW (ref 4.22–5.81)
RDW: 14.8 % (ref 11.5–15.5)
WBC: 6 10*3/uL (ref 4.0–10.5)
nRBC: 0 % (ref 0.0–0.2)

## 2021-12-28 LAB — BASIC METABOLIC PANEL
Anion gap: 3 — ABNORMAL LOW (ref 5–15)
BUN: 21 mg/dL (ref 8–23)
CO2: 25 mmol/L (ref 22–32)
Calcium: 8.7 mg/dL — ABNORMAL LOW (ref 8.9–10.3)
Chloride: 113 mmol/L — ABNORMAL HIGH (ref 98–111)
Creatinine, Ser: 1.25 mg/dL — ABNORMAL HIGH (ref 0.61–1.24)
GFR, Estimated: >60 mL/min (ref >60)
Glucose, Bld: 88 mg/dL (ref 70–99)
Potassium: 4.2 mmol/L (ref 3.5–5.1)
Sodium: 141 mmol/L (ref 135–145)

## 2021-12-28 LAB — MAGNESIUM: Magnesium: 2.1 mg/dL (ref 1.7–2.4)

## 2021-12-28 SURGERY — EGD (ESOPHAGOGASTRODUODENOSCOPY)
Anesthesia: Monitor Anesthesia Care

## 2021-12-28 MED ORDER — DASATINIB 50 MG PO TABS: 50.0000 mg | ORAL_TABLET | Freq: Every day | ORAL | Status: DC

## 2021-12-28 MED ORDER — LIDOCAINE HCL 1 % IJ SOLN
INTRAMUSCULAR | Status: DC | PRN
  Administered 2021-12-28: 50 mg via INTRADERMAL

## 2021-12-28 MED ORDER — HYDRALAZINE HCL 20 MG/ML IJ SOLN: 10.0000 mg | Freq: Four times a day (QID) | INTRAMUSCULAR | Status: DC | PRN

## 2021-12-28 MED ORDER — PROPOFOL 500 MG/50ML IV EMUL
INTRAVENOUS | Status: AC
  Filled 2021-12-28: qty 50

## 2021-12-28 MED ORDER — PROPOFOL 500 MG/50ML IV EMUL
INTRAVENOUS | Status: DC | PRN
  Administered 2021-12-28: 120 ug/kg/min via INTRAVENOUS

## 2021-12-28 MED ORDER — LACTATED RINGERS IV SOLN: INTRAVENOUS | Status: DC | PRN

## 2021-12-28 MED ORDER — PROPOFOL 10 MG/ML IV BOLUS
INTRAVENOUS | Status: DC | PRN
  Administered 2021-12-28 (×2): 10 mg via INTRAVENOUS
  Administered 2021-12-28 (×2): 20 mg via INTRAVENOUS

## 2021-12-28 MED ORDER — LACTATED RINGERS IV SOLN
INTRAVENOUS | Status: AC | PRN
  Administered 2021-12-28: 1000 mL via INTRAVENOUS

## 2021-12-28 MED ORDER — PANTOPRAZOLE SODIUM 40 MG PO TBEC: 40.0000 mg | DELAYED_RELEASE_TABLET | Freq: Two times a day (BID) | ORAL | Status: DC

## 2021-12-28 MED ORDER — CLOPIDOGREL BISULFATE 75 MG PO TABS: 75.0000 mg | ORAL_TABLET | Freq: Every day | ORAL | Status: DC

## 2021-12-28 NOTE — Op Note (Signed)
[facility] Patient Name: Steven [name] Procedure Date: 12/28/2021 MRN: [id number] Attending MD: Otis Brace , MD Date of Birth: [birth year] CSN: [id number] Age: 61 Admit Type: Inpatient Procedure:                Upper GI endoscopy Indications:              Coffee-ground emesis Providers:                Otis Brace, MD, Orvil Feil, RN, Cherylynn Ridges, Technician, Karis Juba, CRNA Referring MD:             Charlies Silvers Medicines:                Sedation Administered by an Anesthesia Professional Complications:            No immediate complications. Estimated Blood Loss:     Estimated blood loss was minimal. Procedure:                Pre-Anesthesia Assessment:                           - Prior to the procedure, a History and Physical                            was performed, and patient medications and                            allergies were reviewed. The patient's tolerance of                            previous anesthesia was also reviewed. The risks                            and benefits of the procedure and the sedation                            options and risks were discussed with the patient.                            All questions were answered, and informed consent                            was obtained. Prior Anticoagulants: The patient has                            taken Plavix (clopidogrel), last dose was 3 days                            prior to procedure. ASA Grade Assessment: III - A                            patient with severe systemic disease. After  reviewing the risks and benefits, the patient was                            deemed in satisfactory condition to undergo the                            procedure.                           After obtaining informed consent, the endoscope was                            passed under direct vision. Throughout the                             procedure, the patient's blood pressure, pulse, and                            oxygen saturations were monitored continuously. The                            [id number]-[id number] ([id number] Olympus endoscope was introduced                            through the mouth, and advanced to the second part                            of duodenum. The upper GI endoscopy was                            accomplished without difficulty. The patient                            tolerated the procedure well. Scope In: Scope Out: Findings:      LA Grade B (one or more mucosal breaks greater than 5 mm, not extending       between the tops of two mucosal folds) esophagitis was found in the       distal esophagus.      A small hiatal hernia was present.      No gross lesions were noted in the entire examined stomach. Biopsies       were taken with a cold forceps for histology.      The duodenal bulb, first portion of the duodenum and second portion of       the duodenum were normal. Impression:               - LA Grade B reflux esophagitis.                           - Small hiatal hernia.                           - No gross lesions in the stomach. Biopsied.                           -  Normal duodenal bulb, first portion of the                            duodenum and second portion of the duodenum. Moderate Sedation:      Moderate (conscious) sedation was personally administered by an       anesthesia professional. The following parameters were monitored: oxygen       saturation, heart rate, blood pressure, and response to care. Recommendation:           - Return patient to hospital ward for ongoing care.                           - Cardiac diet.                           - Continue present medications.                           - Await pathology results.                           - Repeat upper endoscopy in 3 months to check                            healing. Procedure Code(s):        --- Professional ---                            [phone], Esophagogastroduodenoscopy, flexible,                            transoral; with biopsy, single or multiple Diagnosis Code(s):        --- Professional ---                           K21.01, Gastro-esophageal reflux disease with                            esophagitis, with bleeding                           K44.9, Diaphragmatic hernia without obstruction or                            gangrene                           K92.0, Hematemesis CPT copyright 2019 American Medical Association. All rights reserved. The codes documented in this report are preliminary and upon coder review may  be revised to meet current compliance requirements. Otis Brace, MD Otis Brace, MD 12/28/2021 11:14:22 AM Number of Addenda: 0

## 2021-12-28 NOTE — Assessment & Plan Note (Signed)
.   Patient is currently chest pain free . Monitoring patient on telemetry . Holding home regimen of antiplatelet therapy . Continuing home regimen of metoprolol

## 2021-12-28 NOTE — Discharge Instructions (Signed)

## 2021-12-28 NOTE — Transfer of Care (Signed)
Immediate Anesthesia Transfer of Care Note  Patient: Steven [name]  Procedure(s) Performed: ESOPHAGOGASTRODUODENOSCOPY (EGD) BIOPSY  Patient Location: PACU and Endoscopy Unit  Anesthesia Type:MAC  Level of Consciousness: awake, alert , oriented and patient cooperative  Airway & Oxygen Therapy: Patient Spontanous Breathing and Patient connected to face mask oxygen  Post-op Assessment: Report given to RN and Post -op Vital signs reviewed and stable  Post vital signs: Reviewed and stable  Last Vitals:  Vitals Value Taken Time  BP 134/82 12/28/21 1108  Temp 36.7 C 12/28/21 1107  Pulse 78 12/28/21 1109  Resp 17 12/28/21 1109  SpO2 100 % 12/28/21 1109  Vitals shown include unvalidated device data.  Last Pain:  Vitals:   12/28/21 1107  TempSrc: Axillary  PainSc: Asleep         Complications: No notable events documented.

## 2021-12-28 NOTE — Brief Op Note (Signed)
12/27/2021 - 12/28/2021  11:18 AM  PATIENT:  Steven [name]  61 y.o. male  PRE-OPERATIVE DIAGNOSIS:  coffee ground emesis  POST-OPERATIVE DIAGNOSIS:  gastritis  PROCEDURE:  Procedure(s): ESOPHAGOGASTRODUODENOSCOPY (EGD) (N/A) BIOPSY  SURGEON:  Surgeon(s) and Role:    * Creasie Lacosse, MD - Primary  Findings ------------ -EGD showed LA grade B esophagitis with linear esophageal ulcers.  Likely source for patient's coffee-ground emesis.  Recommendations -------------------------- -Advance diet -Protonix 40 mg twice a day for 4 weeks followed by Protonix 40 mg once a day for another 1 to 2 months -Okay to resume Plavix from GI standpoint tomorrow -Commend repeat EGD in 2 to 3 months to document healing of esophagitis -Follow-up in GI office in 2 months after discharge -Okay to discharge from GI standpoint.  GI will sign off.  Call us back if needed  Otis Brace MD, [place] 12/28/2021, 11:19 AM  Contact #  [phone]

## 2021-12-28 NOTE — Progress Notes (Signed)
Progress Note   Patient: Steven [name] PYP:[id number] DOB: 18-Jul-1960 DOA: 12/27/2021     1 DOS: the patient was seen and examined on 12/28/2021   Brief hospital course: 61 y.o. 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   Patient presented to the hospital with complaints of multiple episodes of diarrhea followed by nausea starting Saturday on 12/24/2021.  Patient initially presented to [facility] ER with this complaint on Sunday night where he was found to have GI bleed with hemoglobin drop from 14-13. Patient reports multiple black-colored bowel movements every 15 to 20 minutes medium size mixed with mucus.  No BRBPR reported.  He has been on Sprycel for years.  Denies taking any NSAIDs.  No alcohol abuse.  Patient is also on aspirin and Plavix and has been on DAPT since November 2022. Patient reportedly was supposed to have his gallbladder removed in 2022 and was seeing a surgeon there although due to his MI currently his gallbladder surgery is postponed.  Patient denies any frequent right upper quadrant pain with other than this particular episode since his MI.  Assessment and Plan: * Acute upper gastrointestinal bleeding Patient presenting with dark coffee-ground emesis and melena Patient presenting with hemoglobin of 10.6 which is now down trended to 9.8, baseline of 15.8 three months ago. Status post EGD on 10/18 by Dr. Alessandra Bevels revealing LA grade B esophagitis thought to be the cause of the patient's bleeding Currently continuing 40 mg of intravenous Protonix twice daily which will be transition to oral Protonix in the morning Monitoring for any evidence of further bleeding overnight Possible discharge in the morning  Gastroesophageal reflux disease with esophagitis and hemorrhage Please see assessment and plan above  Coronary artery disease of native artery of native heart with stable angina pectoris [facility]) Patient is currently chest  pain free Monitoring patient on telemetry Holding home regimen of antiplatelet therapy Continuing home regimen of metoprolol    CML (chronic myelocytic leukemia) ([place]) We will resume home regimen of Sprycel Outpatient follow-up with Dr. Bobby Rumpf  Essential hypertension Resume patients home regimen of oral antihypertensives with metoprolol Titrate antihypertensive regimen as necessary to achieve adequate BP control PRN intravenous antihypertensives for excessively elevated blood pressure          Subjective:   Patient denies any more episodes of hematemesis or melena.  Patient does complain of some mild epigastric pain, sore in quality, nonradiating and mild in intensity.  Physical Exam: Vitals:   12/28/21 1117 12/28/21 1127 12/28/21 1149 12/28/21 1344  BP: 136/86 128/80 (!) 144/90 128/76  Pulse: 74 72 72 80  Resp: 18 (!) '21 16 18  '$ Temp:   97.6 F (36.4 C) 98.2 F (36.8 C)  TempSrc:   Oral Oral  SpO2: 99% 100% 100% 97%  Weight:      Height:        Constitutional: Awake alert and oriented x3, no associated distress.   Respiratory: clear to auscultation bilaterally, no wheezing, no crackles. Normal respiratory effort. No accessory muscle use.  Cardiovascular: Regular rate and rhythm, no murmurs / rubs / gallops. No extremity edema. 2+ pedal pulses. No carotid bruits.  Abdomen: Mild epigastric tenderness noted.  Abdomen is soft.  No evidence of intra-abdominal masses.  Positive bowel sounds noted in all quadrants.   Musculoskeletal: No joint deformity upper and lower extremities. Good ROM, no contractures. Normal muscle tone.     Data Reviewed:   CBC revealing white blood cell count  of 6.0, hemoglobin 9.8, hematocrit 29.3, platelet count 197  Family Communication: deferred  Disposition: Status is: Inpatient Remains inpatient appropriate because: Presentation with acute blood loss anemia secondary to upper gastrointestinal bleeding requiring intravenous proton pump  inhibitor therapy, frequent CBCs, expert consultation with gastroenterology and endoscopic intervention.  Planned Discharge Destination: Home    Time spent: 48 minutes  Author: Vernelle Emerald, MD 12/28/2021 7:49 PM  For on call review [email or web address].

## 2021-12-28 NOTE — Anesthesia Postprocedure Evaluation (Signed)
Anesthesia Post Note  Patient: Steven [name]  Procedure(s) Performed: ESOPHAGOGASTRODUODENOSCOPY (EGD) BIOPSY     Patient location during evaluation: PACU Anesthesia Type: MAC Level of consciousness: awake and alert Pain management: pain level controlled Vital Signs Assessment: post-procedure vital signs reviewed and stable Respiratory status: spontaneous breathing, nonlabored ventilation, respiratory function stable and patient connected to nasal cannula oxygen Cardiovascular status: stable and blood pressure returned to baseline Postop Assessment: no apparent nausea or vomiting Anesthetic complications: no   No notable events documented.  Last Vitals:  Vitals:   12/28/21 1127 12/28/21 1149  BP: 128/80 (!) 144/90  Pulse: 72 72  Resp: (!) 21 16  Temp:  36.4 C  SpO2: 100% 100%    Last Pain:  Vitals:   12/28/21 1149  TempSrc: Oral  PainSc:                  Tiajuana Amass

## 2021-12-28 NOTE — Assessment & Plan Note (Signed)
   Patient presenting with dark coffee-ground emesis and melena  Patient presenting with hemoglobin of 10.6 which is now down trended to 9.8, baseline of 15.8 three months ago.  Status post EGD on 10/18 by Dr. Alessandra Bevels revealing LA grade B esophagitis thought to be the cause of the patient's bleeding  Currently continuing 40 mg of intravenous Protonix twice daily which will be transition to oral Protonix in the morning  Monitoring for any evidence of further bleeding overnight  Possible discharge in the morning

## 2021-12-28 NOTE — Hospital Course (Signed)
61 y.o. 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   Patient presented to  [facility] ER with this  where he was found to have GI bleed with hemoglobin drop from 14-13.  Patient report that multiple black-colored bowel movements every 15 to 20 minutes medium size mixed with mucus.   Patient is also on aspirin and Plavix and has been on DAPT since November 2022.  Patient was admitted to the hospital and placed on intravenous Protonix.  Patient has underwent EGD by Dr. Alessandra Bevels with [facility] gastroenterology on 10/18 revealing LA grade B reflux esophagitis with a small hiatal hernia with no gross lesions in the stomach.  After the upper endoscopy patient clinically improved with hemoglobin and hematocrit stabilizing.  Patient was transition to oral Protonix therapy prior to discharge which is to be taken twice daily for 30 days followed by once daily.  Was additionally provided a 50-monthsupply of oral iron to assist in resolving his anemia.  Patient was resumed back on his home regimen of aspirin and Plavix for his CAD without issue.  The time of discharge arrangements were made for the patient to follow-up with Dr. BAlessandra Bevelswith gastroenterology in 6 to 8 weeks with eventual plan for repeat EGD in 2 to 3 months.  Patient was also to follow-up with his primary care provider.

## 2021-12-28 NOTE — Anesthesia Preprocedure Evaluation (Signed)
Anesthesia Evaluation  Patient identified by MRN, date of birth, ID band Patient awake    Reviewed: Allergy & Precautions, NPO status , Patient's Chart, lab work & pertinent test results  Airway Mallampati: III  TM Distance: >3 FB Neck ROM: Full    Dental   Pulmonary former smoker,    breath sounds clear to auscultation       Cardiovascular hypertension, Pt. on medications and Pt. on home beta blockers + CAD, + Past MI and + Cardiac Stents   Rhythm:Regular Rate:Normal     Neuro/Psych negative neurological ROS     GI/Hepatic (+) Hepatitis -GI bleed    Endo/Other  negative endocrine ROS  Renal/GU Renal disease     Musculoskeletal   Abdominal   Peds  Hematology  (+) Blood dyscrasia, anemia ,   Anesthesia Other Findings   Reproductive/Obstetrics                             Anesthesia Physical Anesthesia Plan  ASA: 3  Anesthesia Plan: MAC   Post-op Pain Management: Minimal or no pain anticipated   Induction:   PONV Risk Score and Plan: 1 and Propofol infusion and Ondansetron  Airway Management Planned: Natural Airway and Nasal Cannula  Additional Equipment:   Intra-op Plan:   Post-operative Plan:   Informed Consent: I have reviewed the patients History and Physical, chart, labs and discussed the procedure including the risks, benefits and alternatives for the proposed anesthesia with the patient or authorized representative who has indicated his/her understanding and acceptance.       Plan Discussed with:   Anesthesia Plan Comments:         Anesthesia Quick Evaluation

## 2021-12-28 NOTE — Progress Notes (Signed)
  Transition of Care [facility]) Screening Note   Patient Details  Name: Steven [name] Date of Birth: 01-21-1961   Transition of [facility]) CM/SW Contact:    Lennart Pall, LCSW Phone Number: 12/28/2021, 1:18 PM    Transition of Care Department [facility]) has reviewed patient and no TOC needs have been identified at this time. We will continue to monitor patient advancement through interdisciplinary progression rounds. If new patient transition needs arise, please place a TOC consult.

## 2021-12-28 NOTE — Interval H&P Note (Signed)
History and Physical Interval Note:  12/28/2021 10:36 AM  Steven [name]  has presented today for surgery, with the diagnosis of coffee ground emesis.  The various methods of treatment have been discussed with the patient and family. After consideration of risks, benefits and other options for treatment, the patient has consented to  Procedure(s): ESOPHAGOGASTRODUODENOSCOPY (EGD) (N/A) as a surgical intervention.  The patient's history has been reviewed, patient examined, no change in status, stable for surgery.  I have reviewed the patient's chart and labs.  Questions were answered to the patient's satisfaction.     [name]

## 2021-12-28 NOTE — Progress Notes (Signed)
Prior-To-Admission Oral Chemotherapy for Treatment of Oncologic Disease   Order noted from Dr. Cyd Silence to continue prior-to-admission oral chemotherapy regimen of dasatinib.  Procedure Per Pharmacy & Therapeutics Committee Policy: Orders for continuation of home oral chemotherapy for treatment of an oncologic disease will be held unless approved by an oncologist during current admission.    For patients receiving oncology care at [facility], inpatient pharmacist contacts patient's oncologist during regular office hours to review. If earlier review is medically necessary, attending physician consults [facility] on-call oncologist   For patients receiving oncology care outside of [facility], attending physician consults patient's oncologist to review. If this oncologist or their coverage cannot be reached, attending physician consults [facility] on-call oncologist   Oral chemotherapy continuation order is on hold pending oncologist review, [facility] oncologist Dayton Scrape, NP will be notified by inpatient pharmacy during office hours      Peggyann Juba, PharmD, [place]: [phone] 12/28/2021, 7:54 PM

## 2021-12-28 NOTE — Assessment & Plan Note (Signed)
.   Resume patients home regimen of oral antihypertensives with metoprolol . Titrate antihypertensive regimen as necessary to achieve adequate BP control . PRN intravenous antihypertensives for excessively elevated blood pressure

## 2021-12-28 NOTE — Assessment & Plan Note (Signed)
·   Please see assessment and plan above °

## 2021-12-28 NOTE — Assessment & Plan Note (Signed)
   We will resume home regimen of Sprycel  Outpatient follow-up with Dr. Bobby Rumpf

## 2021-12-29 DIAGNOSIS — K922 Gastrointestinal hemorrhage, unspecified: Secondary | ICD-10-CM | POA: Diagnosis not present

## 2021-12-29 DIAGNOSIS — I25118 Atherosclerotic heart disease of native coronary artery with other forms of angina pectoris: Secondary | ICD-10-CM | POA: Diagnosis not present

## 2021-12-29 DIAGNOSIS — K2101 Gastro-esophageal reflux disease with esophagitis, with bleeding: Secondary | ICD-10-CM | POA: Diagnosis not present

## 2021-12-29 DIAGNOSIS — I10 Essential (primary) hypertension: Secondary | ICD-10-CM | POA: Diagnosis not present

## 2021-12-29 LAB — CBC WITH DIFFERENTIAL/PLATELET
Abs Immature Granulocytes: 0.03 10*3/uL (ref 0.00–0.07)
Basophils Absolute: 0 10*3/uL (ref 0.0–0.1)
Basophils Relative: 1 %
Eosinophils Absolute: 0.5 10*3/uL (ref 0.0–0.5)
Eosinophils Relative: 10 %
HCT: 28.3 % — ABNORMAL LOW (ref 39.0–52.0)
Hemoglobin: 9.6 g/dL — ABNORMAL LOW (ref 13.0–17.0)
Immature Granulocytes: 1 %
Lymphocytes Relative: 25 %
Lymphs Abs: 1.4 10*3/uL (ref 0.7–4.0)
MCH: 30.5 pg (ref 26.0–34.0)
MCHC: 33.9 g/dL (ref 30.0–36.0)
MCV: 89.8 fL (ref 80.0–100.0)
Monocytes Absolute: 0.4 10*3/uL (ref 0.1–1.0)
Monocytes Relative: 7 %
Neutro Abs: 3.2 10*3/uL (ref 1.7–7.7)
Neutrophils Relative %: 56 %
Platelets: 186 10*3/uL (ref 150–400)
RBC: 3.15 MIL/uL — ABNORMAL LOW (ref 4.22–5.81)
RDW: 14.5 % (ref 11.5–15.5)
WBC: 5.5 10*3/uL (ref 4.0–10.5)
nRBC: 0 % (ref 0.0–0.2)

## 2021-12-29 LAB — SURGICAL PATHOLOGY

## 2021-12-29 LAB — MAGNESIUM: Magnesium: 2.2 mg/dL (ref 1.7–2.4)

## 2021-12-29 LAB — IRON AND TIBC
Iron: 77 ug/dL (ref 45–182)
Saturation Ratios: 31 % (ref 17.9–39.5)
TIBC: 247 ug/dL — ABNORMAL LOW (ref 250–450)
UIBC: 170 ug/dL

## 2021-12-29 LAB — COMPREHENSIVE METABOLIC PANEL
ALT: 25 U/L (ref 0–44)
AST: 27 U/L (ref 15–41)
Albumin: 3.1 g/dL — ABNORMAL LOW (ref 3.5–5.0)
Alkaline Phosphatase: 51 U/L (ref 38–126)
Anion gap: 3 — ABNORMAL LOW (ref 5–15)
BUN: 17 mg/dL (ref 8–23)
CO2: 23 mmol/L (ref 22–32)
Calcium: 8.5 mg/dL — ABNORMAL LOW (ref 8.9–10.3)
Chloride: 112 mmol/L — ABNORMAL HIGH (ref 98–111)
Creatinine, Ser: 1.22 mg/dL (ref 0.61–1.24)
GFR, Estimated: >60 mL/min (ref >60)
Glucose, Bld: 100 mg/dL — ABNORMAL HIGH (ref 70–99)
Potassium: 3.9 mmol/L (ref 3.5–5.1)
Sodium: 138 mmol/L (ref 135–145)
Total Bilirubin: 0.4 mg/dL (ref 0.3–1.2)
Total Protein: 5.5 g/dL — ABNORMAL LOW (ref 6.5–8.1)

## 2021-12-29 MED ORDER — FERROUS SULFATE 325 (65 FE) MG PO TBEC: 325.0000 mg | DELAYED_RELEASE_TABLET | Freq: Two times a day (BID) | ORAL | 1 refills | Status: DC

## 2021-12-29 MED ORDER — ASPIRIN 81 MG PO TBEC: 81.0000 mg | DELAYED_RELEASE_TABLET | Freq: Every day | ORAL | Status: DC

## 2021-12-29 MED ORDER — PANTOPRAZOLE SODIUM 40 MG PO TBEC: DELAYED_RELEASE_TABLET | ORAL | 0 refills | Status: DC

## 2021-12-29 NOTE — Plan of Care (Signed)
Discharge instructions given to the patient including medications and follow up.  

## 2021-12-29 NOTE — Plan of Care (Signed)
  Problem: Health Behavior/Discharge Planning: Goal: Ability to manage health-related needs will improve Outcome: Progressing   Problem: Clinical Measurements: Goal: Ability to maintain clinical measurements within normal limits will improve Outcome: Progressing   Problem: Coping: Goal: Level of anxiety will decrease Outcome: Progressing   Problem: Nutrition: Goal: Adequate nutrition will be maintained Outcome: Progressing

## 2021-12-29 NOTE — Plan of Care (Signed)
  Problem: Education: Goal: Knowledge of General Education information will improve Description: Including pain rating scale, medication(s)/side effects and non-pharmacologic comfort measures Outcome: Adequate for Discharge   Problem: Clinical Measurements: Goal: Respiratory complications will improve Outcome: Not Applicable Goal: Cardiovascular complication will be avoided Outcome: Not Applicable   Problem: Clinical Measurements: Goal: Cardiovascular complication will be avoided Outcome: Not Applicable   Problem: Activity: Goal: Risk for activity intolerance will decrease Outcome: Adequate for Discharge   Problem: Nutrition: Goal: Adequate nutrition will be maintained Outcome: Adequate for Discharge

## 2021-12-29 NOTE — Discharge Summary (Signed)
Physician Discharge Summary   Patient: Steven [name] MRN: [id number] DOB: 1960-07-29  Admit date:     12/27/2021  Discharge date: 12/29/21  Discharge Physician: Vernelle Emerald   PCP: Mckinley Jewel, MD   Recommendations at discharge:   Take your regimen of Protonix twice daily for 1 month followed by daily as instructed Take your regimen of ferrous sulfate (iron) by mouth twice daily on an empty stomach with a glass of orange juice for a course of 2 months.  If you are unable to tolerate this you may eat shortly thereafter. Follow-up with your primary care provider Dr. Burt Ek on 11/6 at 9 AM Follow-up with Dr. Alessandra Bevels with [facility] gastroenterology in 4 to 6 weeks  Discharge Diagnoses: Principal Problem:   Acute upper gastrointestinal bleeding Active Problems:   Gastroesophageal reflux disease with esophagitis and hemorrhage   Coronary artery disease of native artery of native heart with stable angina pectoris (HCC)   CML (chronic myelocytic leukemia) ([place])   Essential hypertension  Resolved Problems:   * No resolved hospital problems. *  Hospital Course: 61 y.o. 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   Patient presented to  [facility] ER with this  where he was found to have GI bleed with hemoglobin drop from 14-13.  Patient report that multiple black-colored bowel movements every 15 to 20 minutes medium size mixed with mucus.   Patient is also on aspirin and Plavix and has been on DAPT since November 2022.  Patient was admitted to the hospital and placed on intravenous Protonix.  Patient has underwent EGD by Dr. Alessandra Bevels with [facility] gastroenterology on 10/18 revealing LA grade B reflux esophagitis with a small hiatal hernia with no gross lesions in the stomach.  After the upper endoscopy patient clinically improved with hemoglobin and hematocrit stabilizing.  Patient was transition to oral Protonix therapy prior to discharge  which is to be taken twice daily for 30 days followed by once daily.  Was additionally provided a 16-monthsupply of oral iron to assist in resolving his anemia.  Patient was resumed back on his home regimen of aspirin and Plavix for his CAD without issue.  The time of discharge arrangements were made for the patient to follow-up with Dr. BAlessandra Bevelswith gastroenterology in 6 to 8 weeks with eventual plan for repeat EGD in 2 to 3 months.  Patient was also to follow-up with his primary care provider.      Pain control - [place] was reviewed. and patient was instructed, not to drive, operate heavy machinery, perform activities at heights, swimming or participation in water activities or provide baby-sitting services while on Pain, Sleep and Anxiety Medications; until their outpatient Physician has advised to do so again. Also recommended to not to take more than prescribed Pain, Sleep and Anxiety Medications.  Consultants: BAlessandra Bevelswith [facility] Procedures performed: Upper endoscopy performed on 10/18 revealing LA grade B esophagitis. Disposition: Home Diet recommendation:  Cardiac diet DISCHARGE MEDICATION: Allergies as of 12/29/2021       Reactions   Atorvastatin Other (See Comments)   myalgias   Rosuvastatin Other (See Comments)   myalgias        Medication List     STOP taking these medications    benzonatate 200 MG capsule Commonly known as: TESSALON       TAKE these medications    acetaminophen 500 MG tablet Commonly known as:  TYLENOL Take 500 mg by mouth as needed for headache.   Bayer Aspirin EC Low Dose 81 MG tablet Generic drug: aspirin EC Take 81 mg by mouth daily.   clopidogrel 75 MG tablet Commonly known as: PLAVIX Take 1 tablet (75 mg total) by mouth daily.   colchicine 0.6 MG tablet Take 0.6 mg by mouth as needed (gout).   ferrous sulfate 325 (65 FE) MG EC tablet Take 1 tablet (325  mg total) by mouth 2 (two) times daily. Take on an empty stomach with a cup of orange juice.  If you are unable to tolerate this you may eat afterwards.   metoprolol tartrate 50 MG tablet Commonly known as: LOPRESSOR Take 1 tablet (50 mg total) by mouth 2 (two) times daily.   nitroGLYCERIN 0.4 MG SL tablet Commonly known as: NITROSTAT Place 1 tablet (0.4 mg total) under the tongue every 5 (five) minutes as needed for chest pain.   pantoprazole 40 MG tablet Commonly known as: PROTONIX Take 1 tablet (40 mg total) by mouth 2 (two) times daily before a meal for 30 days, THEN 1 tablet (40 mg total) daily. Use this prescription first. Start taking on: December 29, 2021   Repatha 140 MG/ML Sosy Generic drug: Evolocumab Inject 140 mg into the skin every 14 (fourteen) days.   Sprycel 50 MG tablet Generic drug: dasatinib Take 50 mg by mouth daily.        Follow-up Information     Gastroenterology, [facility]. Schedule an appointment as soon as possible for a visit in 2 month(s).   Why: Follow-up for esophagitis and GI bleed Contact information: [address] [place] [address] [phone]                Discharge Exam: Filed Weights   12/27/21 1616 12/28/21 0957  Weight: 99.8 kg 99.8 kg   Constitutional: Awake alert and oriented x3, no associated distress.   Respiratory: clear to auscultation bilaterally, no wheezing, no crackles. Normal respiratory effort. No accessory muscle use.  Cardiovascular: Regular rate and rhythm, no murmurs / rubs / gallops. No extremity edema. 2+ pedal pulses. No carotid bruits.  Abdomen: Mild epigastric tenderness, abdomen is soft.  No evidence of intra-abdominal masses.  Positive bowel sounds noted in all quadrants.   Musculoskeletal: No joint deformity upper and lower extremities. Good ROM, no contractures. Normal muscle tone.     Condition at discharge: fair  The results of significant diagnostics from this hospitalization  (including imaging, microbiology, ancillary and laboratory) are listed below for reference.   Imaging Studies: US Abdomen Limited RUQ (LIVER/GB)  Result Date: 12/27/2021 CLINICAL DATA:  Right upper quadrant pain EXAM: ULTRASOUND ABDOMEN LIMITED RIGHT UPPER QUADRANT COMPARISON:  CT abdomen pelvis 12/26/2021 FINDINGS: Gallbladder: No gallstones or wall thickening visualized. No sonographic Murphy sign noted by sonographer. Common bile duct: Diameter: 3.8 mm Liver: Increased echogenicity liver diffusely. No focal liver lesion. Portal vein patent on color Doppler imaging with normal direction of blood flow towards the liver. Other: None. IMPRESSION: 1. Negative for gallstones. 2. Negative for biliary dilatation. 3. Echogenic liver compatible with fatty infiltration. Electronically Signed   By: Franchot Gallo M.D.   On: 12/27/2021 18:30    Microbiology: Results for orders placed or performed during the hospital encounter of 02/03/21  MRSA Next Gen by PCR, Nasal     Status: None   Collection Time: 02/03/21  9:34 PM   Specimen: Nasopharyngeal Swab; Nasal Swab  Result Value Ref Range Status  MRSA by PCR Next Gen NOT DETECTED NOT DETECTED Final    Comment: (NOTE) The GeneXpert MRSA Assay (FDA approved for NASAL specimens only), is one component of a comprehensive MRSA colonization surveillance program. It is not intended to diagnose MRSA infection nor to guide or monitor treatment for MRSA infections. Test performance is not FDA approved in patients less than 65 years old. Performed at [facility], [place] [address] [place], [place] [address]   Resp Panel by RT-PCR (Flu A&B, Covid) Nasopharyngeal Swab     Status: None   Collection Time: 02/03/21  9:36 PM   Specimen: Nasopharyngeal Swab; Nasopharyngeal(NP) swabs in vial transport medium  Result Value Ref Range Status   SARS Coronavirus 2 by RT PCR NEGATIVE NEGATIVE Final    Comment: (NOTE) SARS-CoV-2 target nucleic acids are NOT  DETECTED.  The SARS-CoV-2 RNA is generally detectable in upper respiratory specimens during the acute phase of infection. The lowest concentration of SARS-CoV-2 viral copies this assay can detect is 138 copies/mL. A negative result does not preclude SARS-Cov-2 infection and should not be used as the sole basis for treatment or other patient management decisions. A negative result may occur with  improper specimen collection/handling, submission of specimen other than nasopharyngeal swab, presence of viral mutation(s) within the areas targeted by this assay, and inadequate number of viral copies(<138 copies/mL). A negative result must be combined with clinical observations, patient history, and epidemiological information. The expected result is Negative.  Fact Sheet for Patients:  EntrepreneurPulse.com.au  Fact Sheet for Healthcare Providers:  IncredibleEmployment.be  This test is no t yet approved or cleared by the [place] FDA and  has been authorized for detection and/or diagnosis of SARS-CoV-2 by FDA under an Emergency Use Authorization (EUA). This EUA will remain  in effect (meaning this test can be used) for the duration of the COVID-19 declaration under Section 564(b)(1) of the Act, 21 U.S.C.section 360bbb-3(b)(1), unless the authorization is terminated  or revoked sooner.       Influenza A by PCR NEGATIVE NEGATIVE Final   Influenza B by PCR NEGATIVE NEGATIVE Final    Comment: (NOTE) The Xpert Xpress SARS-CoV-2/FLU/RSV plus assay is intended as an aid in the diagnosis of influenza from Nasopharyngeal swab specimens and should not be used as a sole basis for treatment. Nasal washings and aspirates are unacceptable for Xpert Xpress SARS-CoV-2/FLU/RSV testing.  Fact Sheet for Patients: EntrepreneurPulse.com.au  Fact Sheet for Healthcare Providers: IncredibleEmployment.be  This test is not yet  approved or cleared by the [place] FDA and has been authorized for detection and/or diagnosis of SARS-CoV-2 by FDA under an Emergency Use Authorization (EUA). This EUA will remain in effect (meaning this test can be used) for the duration of the COVID-19 declaration under Section 564(b)(1) of the Act, 21 U.S.C. section 360bbb-3(b)(1), unless the authorization is terminated or revoked.  Performed at [facility], [place] [address], [place], [place] [address]     Labs: CBC: Recent Labs  Lab 12/27/21 1102 12/28/21 0321 12/29/21 0325  WBC 7.0 6.0 5.5  NEUTROABS 4.6  --  3.2  HGB 10.6* 9.8* 9.6*  HCT 31.1* 29.3* 28.3*  MCV 88.9 90.2 89.8  PLT 200 197 517   Basic Metabolic Panel: Recent Labs  Lab 12/27/21 1102 12/28/21 0321 12/29/21 0325  NA 143 141 138  K 4.4 4.2 3.9  CL 114* 113* 112*  CO2 '24 25 23  '$ GLUCOSE 97 88 100*  BUN 34* 21 17  CREATININE  1.33* 1.25* 1.22  CALCIUM 9.0 8.7* 8.5*  MG  --  2.1 2.2   Liver Function Tests: Recent Labs  Lab 12/27/21 1102 12/29/21 0325  AST 16 27  ALT 19 25  ALKPHOS 43 51  BILITOT 0.4 0.4  PROT 6.1* 5.5*  ALBUMIN 3.6 3.1*   CBG: No results for input(s): "GLUCAP" in the last 168 hours.  Discharge time spent: greater than 30 minutes.  Signed: Vernelle Emerald, MD [facility] 12/29/2021

## 2022-01-01 ENCOUNTER — Encounter (HOSPITAL_COMMUNITY): Payer: Self-pay | Admitting: Gastroenterology

## 2022-01-03 NOTE — Progress Notes (Unsigned)
Cardiology Office Note:   Date:  01/04/2022  NAME:  Steven [name]    MRN: [id number] DOB:  [birth year]   PCP:  Mckinley Jewel, MD  Cardiologist:  Evalina Field, MD  Electrophysiologist:  None   Referring MD: Mckinley Jewel, MD   Chief Complaint  Patient presents with   Shortness of Breath   History of Present Illness:   Steven [name] is a 61 y.o. male with a hx of CAD, CML, HTN, HLD who presents for follow-up. He reports he was recently admitted to the hospital for hematemesis.  Found to have esophagitis and gastric erosions.  He was put back on aspirin and Plavix.  Hemoglobin down to 9.6.  He still getting short of breath.  Can occur at any time.  Can occur with activity.  Can occur at rest.  No chest pain or chest pressure.  He is doing a lot of physical work.  He is walking all day.  We discussed cardiac PET stress test to exclude definitively any cardiac pathology.  His echocardiogram was normal last year.  We also discussed stopping Plavix.  He has been on aspirin and Plavix for roughly 11 months.  I think it is okay to stop.  Blood pressure well controlled.  LDL cholesterol well controlled.  Overall doing well.  Problem List NSTEMI/CAD -02/03/2021 -PCI to mid LCX/mid LAD 2. HLD -T chol 125, HDL 46, LDL 66, TG 58 3. HTN 4. CML  Past Medical History: Past Medical History:  Diagnosis Date   Chest pain    a. Cath 2007:  luminal irregs, normal LVF (Dr. Doylene Canard) - was placed on plavix afterwards.;   b.  ETT-MV 11/13:   mild inferior ischemia, EF 59%.   CKD (chronic kidney disease), stage II    CML (chronic myeloid leukemia) ([place])    a. Dx 2011 - followed @ [facility] - remission   Coronary artery disease    Hepatitis    IN THE 1980'S   Hyperlipidemia    a. Dx 2006   Hypertension    a. Dx 2006   Pneumonia    hx of PNA    Past Surgical History: Past Surgical History:  Procedure Laterality Date   BIOPSY  12/28/2021   Procedure: BIOPSY;   Surgeon: Otis Brace, MD;  Location: [facility] ENDOSCOPY;  Service: Gastroenterology;;   CARDIAC CATHETERIZATION  02/16/2012   30% tubular prox RCA stenosis, normal coronaries otherwise; LVEF 65%, no WMAs   CORONARY/GRAFT ACUTE MI REVASCULARIZATION N/A 02/03/2021   Procedure: Coronary/Graft Acute MI Revascularization;  Surgeon: Burnell Blanks, MD;  Location: [facility];  Service: Cardiovascular;  Laterality: N/A;   ESOPHAGOGASTRODUODENOSCOPY N/A 12/28/2021   Procedure: ESOPHAGOGASTRODUODENOSCOPY (EGD);  Surgeon: Otis Brace, MD;  Location: [facility];  Service: Gastroenterology;  Laterality: N/A;   LEFT HEART CATH AND CORONARY ANGIOGRAPHY N/A 02/03/2021   Procedure: LEFT HEART CATH AND CORONARY ANGIOGRAPHY;  Surgeon: Burnell Blanks, MD;  Location: [facility];  Service: Cardiovascular;  Laterality: N/A;   LEFT HEART CATHETERIZATION WITH CORONARY ANGIOGRAM N/A 02/16/2012   Procedure: LEFT HEART CATHETERIZATION WITH CORONARY ANGIOGRAM;  Surgeon: Josue Hector, MD;  Location: [facility] CATH LAB;  Service: Cardiovascular;  Laterality: N/A;   Left Rotator Cuff Repair     a. approx. 2003    Current Medications: Current Meds  Medication Sig   acetaminophen (TYLENOL) 500 MG tablet Take 500 mg by mouth as needed for headache.   aspirin EC (  BAYER ASPIRIN EC LOW DOSE) 81 MG tablet Take 81 mg by mouth daily.   colchicine 0.6 MG tablet Take 0.6 mg by mouth as needed (gout).   dasatinib (SPRYCEL) 50 MG tablet Take 50 mg by mouth daily.   Evolocumab (REPATHA) 140 MG/ML SOSY Inject 140 mg into the skin every 14 (fourteen) days.   ferrous sulfate 325 (65 FE) MG EC tablet Take 1 tablet (325 mg total) by mouth 2 (two) times daily. Take on an empty stomach with a cup of orange juice.  If you are unable to tolerate this you may eat afterwards.   metoprolol tartrate (LOPRESSOR) 50 MG tablet Take 1 tablet (50 mg total) by mouth 2 (two) times daily.   nitroGLYCERIN (NITROSTAT) 0.4 MG  SL tablet Place 1 tablet (0.4 mg total) under the tongue every 5 (five) minutes as needed for chest pain.   pantoprazole (PROTONIX) 40 MG tablet Take 1 tablet (40 mg total) by mouth 2 (two) times daily before a meal for 30 days, THEN 1 tablet (40 mg total) daily. Use this prescription first.   [DISCONTINUED] clopidogrel (PLAVIX) 75 MG tablet Take 1 tablet (75 mg total) by mouth daily.     Allergies:    Atorvastatin and Rosuvastatin   Social History: Social History   Socioeconomic History   Marital status: Divorced    Spouse name: Not on file   Number of children: Not on file   Years of education: Not on file   Highest education level: Not on file  Occupational History   Not on file  Tobacco Use   Smoking status: Former    Packs/day: 1.00    Years: 30.00    Total pack years: 30.00    Types: Cigarettes    Quit date: 02/14/2005    Years since quitting: 16.8   Smokeless tobacco: Never   Tobacco comments:    smoked 1-1/12 ppd for better part of 30 years  Substance and Sexual Activity   Alcohol use: Yes    Comment: Rare drink   Drug use: No   Sexual activity: Not on file  Other Topics Concern   Not on file  Social History Narrative   Lives in [place] by himself.  Works as an Clinical biochemist.   Social Determinants of Health   Financial Resource Strain: Not on file  Food Insecurity: No Food Insecurity (12/27/2021)   Hunger Vital Sign    Worried About Running Out of Food in the Last Year: Never true    Ran Out of Food in the Last Year: Never true  Transportation Needs: No Transportation Needs (12/27/2021)   PRAPARE - Hydrologist (Medical): No    Lack of Transportation (Non-Medical): No  Physical Activity: Not on file  Stress: Not on file  Social Connections: Not on file     Family History: The patient's family history includes Heart attack in his father; Other in his brother and mother.  ROS:   All other ROS reviewed and negative.  Pertinent positives noted in the HPI.     EKGs/Labs/Other Studies Reviewed:   The following studies were personally reviewed by me today:   TTE 02/04/2021  1. Left ventricular ejection fraction, by estimation, is 55 to 60%. The  left ventricle has normal function. The left ventricle has no regional  wall motion abnormalities. Left ventricular diastolic parameters were  normal.   2. Right ventricular systolic function is normal. The right ventricular  size is normal. There  is normal pulmonary artery systolic pressure.   3. Left atrial size was moderately dilated.   4. The mitral valve is abnormal. Mild mitral valve regurgitation. No  evidence of mitral stenosis.   5. The aortic valve is tricuspid. There is mild calcification of the  aortic valve. Aortic valve regurgitation is not visualized. Aortic valve  sclerosis is present, with no evidence of aortic valve stenosis.   6. The inferior vena cava is normal in size with greater than 50%  respiratory variability, suggesting right atrial pressure of 3 mmHg.   LHC 02/03/2021 NSTEMI secondary to sub-total mid Circumflex occlusion. Successful PTCA/DES x 1 mid Circumflex Severe mid LAD stenosis Successful PTCA/DES x 1 mid LAD Mild non-obstructive disease in the mid and distal RCA Normal LVEDP  Recent Labs: 02/03/2021: B Natriuretic Peptide 33.2; TSH 1.631 12/29/2021: ALT 25; BUN 17; Creatinine, Ser 1.22; Hemoglobin 9.6; Magnesium 2.2; Platelets 186; Potassium 3.9; Sodium 138   Recent Lipid Panel    Component Value Date/Time   CHOL 125 07/21/2021 0811   TRIG 58 07/21/2021 0811   HDL 46 07/21/2021 0811   CHOLHDL 2.7 07/21/2021 0811   CHOLHDL 4.1 02/04/2021 0127   VLDL 28 02/04/2021 0127   LDLCALC 66 07/21/2021 0811    Physical Exam:   VS:  BP 130/72   Pulse 85   Ht '5\' 10"'$  (1.778 m)   Wt 219 lb 12.8 oz (99.7 kg)   SpO2 98%   BMI 31.54 kg/m    Wt Readings from Last 3 Encounters:  01/04/22 219 lb 12.8 oz (99.7 kg)  12/28/21  220 lb 0.3 oz (99.8 kg)  10/10/21 221 lb 4.8 oz (100.4 kg)    General: Well nourished, well developed, in no acute distress Head: Atraumatic, normal size  Eyes: PEERLA, EOMI  Neck: Supple, no JVD Endocrine: No thryomegaly Cardiac: Normal S1, S2; RRR; no murmurs, rubs, or gallops Lungs: Clear to auscultation bilaterally, no wheezing, rhonchi or rales  Abd: Soft, nontender, no hepatomegaly  Ext: No edema, pulses 2+ Musculoskeletal: No deformities, BUE and BLE strength normal and equal Skin: Warm and dry, no rashes   Neuro: Alert and oriented to person, place, time, and situation, CNII-XII grossly intact, no focal deficits  Psych: Normal mood and affect   ASSESSMENT:   Steven [name] is a 61 y.o. male who presents for the following: 1. SOB (shortness of breath)   2. Coronary artery disease involving native coronary artery of native heart without angina pectoris   3. Mixed hyperlipidemia   4. Primary hypertension     PLAN:   1. SOB (shortness of breath) -Continues to report shortness of breath.  I believe his anemia could be contributing.  He had this before being anemic.  Echocardiogram showed normal LV function last year.  He has had for revascularization.  I recommended a cardiac PET stress test to further elucidate this.  Risk and benefits explained to the patient.  He is willing to proceed.  His symptoms could also be COPD related.  30-pack-year history.  He is going to discuss pulmonary function testing with his primary care physician.  2. Coronary artery disease involving native coronary artery of native heart without angina pectoris 3. Mixed hyperlipidemia -Non-STEMI last year in November.  He has completed 11 months of DAPT.  Now struggling with GI bleeding.  We will stop his Plavix.  Continue aspirin.  LDL cholesterol is at goal on Repatha.  Blood pressure is well controlled as well.  He will see  [place] yearly.  See above discussion for cardiac PET stress test.  4. Primary  hypertension -BP well controlled.   Shared Decision Making/Informed Consent The risks [chest pain, shortness of breath, cardiac arrhythmias, dizziness, blood pressure fluctuations, myocardial infarction, stroke/transient ischemic attack, nausea, vomiting, allergic reaction, radiation exposure, metallic taste sensation and life-threatening complications (estimated to be 1 in 10,000)], benefits (risk stratification, diagnosing coronary artery disease, treatment guidance) and alternatives of a cardiac PET stress test were discussed in detail with Steven [name] and he agrees to proceed.  Disposition: Return in about 1 year (around 01/05/2023).  Medication Adjustments/Labs and Tests Ordered: Current medicines are reviewed at length with the patient today.  Concerns regarding medicines are outlined above.  Orders Placed This Encounter  Procedures   NM PET CT CARDIAC PERFUSION MULTI W/ABSOLUTE BLOODFLOW   Cardiac Stress Test: Informed Consent Details: Physician/Practitioner Attestation; Transcribe to consent form and obtain patient signature   No orders of the defined types were placed in this encounter.   Patient Instructions  Medication Instructions:  STOP Plavix   *If you need a refill on your cardiac medications before your next appointment, please call your pharmacy*    Testing/Procedures: CARDIAC PET- Your physician has requested that you have a Cardiac Pet Stress Test. [facility] [place] [address]). The schedulers will call you to get this scheduled. Please follow instructions below and call the office with any questions/concerns [phone]).   Follow-Up: At [facility], you and your health needs are our priority.  As part of our continuing mission to provide you with exceptional heart care, we have created designated Provider Care Teams.  These Care Teams include your primary Cardiologist  (physician) and Advanced Practice Providers (APPs -  Physician Assistants and Nurse Practitioners) who all work together to provide you with the care you need, when you need it.  We recommend signing up for the patient portal called "MyChart".  Sign up information is provided on this After Visit Summary.  MyChart is used to connect with patients for Virtual Visits (Telemedicine).  Patients are able to view lab/test results, encounter notes, upcoming appointments, etc.  Non-urgent messages can be sent to your provider as well.   To learn more about what you can do with MyChart, go to [email or web address].    Your next appointment:   12 month(s)  The format for your next appointment:   In Person  Provider:   Evalina Field, MD           Time Spent with Patient: I have spent a total of 35 minutes with patient reviewing hospital notes, telemetry, EKGs, labs and examining the patient as well as establishing an assessment and plan that was discussed with the patient.  > 50% of time was spent in direct patient care.  Signed, Addison Naegeli. Audie Box, MD, [place]  [address], [place], [place] [address] [phone]  01/04/2022 8:50 AM

## 2022-01-04 ENCOUNTER — Encounter: Payer: Self-pay | Admitting: Cardiovascular Disease

## 2022-01-04 ENCOUNTER — Ambulatory Visit: Payer: BC Managed Care – PPO | Attending: Cardiovascular Disease | Admitting: Cardiovascular Disease

## 2022-01-04 VITALS — BP 130/72 | HR 85 | Ht 70.0 in | Wt 219.8 lb

## 2022-01-04 DIAGNOSIS — E782 Mixed hyperlipidemia: Secondary | ICD-10-CM | POA: Diagnosis not present

## 2022-01-04 DIAGNOSIS — R0602 Shortness of breath: Secondary | ICD-10-CM

## 2022-01-04 DIAGNOSIS — I10 Essential (primary) hypertension: Secondary | ICD-10-CM | POA: Diagnosis not present

## 2022-01-04 DIAGNOSIS — I2510 Atherosclerotic heart disease of native coronary artery without angina pectoris: Secondary | ICD-10-CM

## 2022-01-04 NOTE — Patient Instructions (Signed)
Medication Instructions:  STOP Plavix   *If you need a refill on your cardiac medications before your next appointment, please call your pharmacy*    Testing/Procedures: CARDIAC PET- Your physician has requested that you have a Cardiac Pet Stress Test. [facility] [place] [address]). The schedulers will call you to get this scheduled. Please follow instructions below and call the office with any questions/concerns [phone]).   Follow-Up: At [facility], you and your health needs are our priority.  As part of our continuing mission to provide you with exceptional heart care, we have created designated Provider Care Teams.  These Care Teams include your primary Cardiologist (physician) and Advanced Practice Providers (APPs -  Physician Assistants and Nurse Practitioners) who all work together to provide you with the care you need, when you need it.  We recommend signing up for the patient portal called "MyChart".  Sign up information is provided on this After Visit Summary.  MyChart is used to connect with patients for Virtual Visits (Telemedicine).  Patients are able to view lab/test results, encounter notes, upcoming appointments, etc.  Non-urgent messages can be sent to your provider as well.   To learn more about what you can do with MyChart, go to [email or web address].    Your next appointment:   12 month(s)  The format for your next appointment:   In Person  Provider:   Evalina Field, MD

## 2022-01-19 DIAGNOSIS — Z23 Encounter for immunization: Secondary | ICD-10-CM | POA: Diagnosis not present

## 2022-01-19 DIAGNOSIS — K2100 Gastro-esophageal reflux disease with esophagitis, without bleeding: Secondary | ICD-10-CM | POA: Diagnosis not present

## 2022-01-19 DIAGNOSIS — I2510 Atherosclerotic heart disease of native coronary artery without angina pectoris: Secondary | ICD-10-CM | POA: Diagnosis not present

## 2022-01-19 DIAGNOSIS — I10 Essential (primary) hypertension: Secondary | ICD-10-CM | POA: Diagnosis not present

## 2022-01-19 DIAGNOSIS — K2971 Gastritis, unspecified, with bleeding: Secondary | ICD-10-CM | POA: Diagnosis not present

## 2022-01-19 DIAGNOSIS — E782 Mixed hyperlipidemia: Secondary | ICD-10-CM | POA: Diagnosis not present

## 2022-01-23 DIAGNOSIS — M7662 Achilles tendinitis, left leg: Secondary | ICD-10-CM | POA: Diagnosis not present

## 2022-01-23 DIAGNOSIS — M722 Plantar fascial fibromatosis: Secondary | ICD-10-CM | POA: Diagnosis not present

## 2022-01-23 DIAGNOSIS — M222X1 Patellofemoral disorders, right knee: Secondary | ICD-10-CM | POA: Diagnosis not present

## 2022-01-26 ENCOUNTER — Other Ambulatory Visit: Payer: Self-pay

## 2022-01-26 ENCOUNTER — Ambulatory Visit (INDEPENDENT_AMBULATORY_CARE_PROVIDER_SITE_OTHER): Payer: BC Managed Care – PPO | Admitting: Internal Medicine

## 2022-01-26 DIAGNOSIS — R0609 Other forms of dyspnea: Secondary | ICD-10-CM

## 2022-01-26 LAB — PULMONARY FUNCTION TEST
DL/VA % pred: 94 %
DL/VA: 3.94 ml/min/mmHg/L
DLCO cor % pred: 84 %
DLCO cor: 23.49 ml/min/mmHg
DLCO unc % pred: 69 %
DLCO unc: 19.54 ml/min/mmHg
FEF 25-75 Post: 2.89 L/sec
FEF 25-75 Pre: 2.26 L/sec
FEF2575-%Change-Post: 27 %
FEF2575-%Pred-Post: 99 %
FEF2575-%Pred-Pre: 77 %
FEV1-%Change-Post: 5 %
FEV1-%Pred-Post: 84 %
FEV1-%Pred-Pre: 79 %
FEV1-Post: 3.07 L
FEV1-Pre: 2.9 L
FEV1FVC-%Change-Post: 0 %
FEV1FVC-%Pred-Pre: 98 %
FEV6-%Change-Post: 5 %
FEV6-%Pred-Post: 87 %
FEV6-%Pred-Pre: 83 %
FEV6-Post: 4.05 L
FEV6-Pre: 3.85 L
FEV6FVC-%Change-Post: 0 %
FEV6FVC-%Pred-Post: 104 %
FEV6FVC-%Pred-Pre: 104 %
FVC-%Change-Post: 5 %
FVC-%Pred-Post: 84 %
FVC-%Pred-Pre: 80 %
FVC-Post: 4.1 L
FVC-Pre: 3.91 L
Post FEV1/FVC ratio: 75 %
Post FEV6/FVC ratio: 99 %
Pre FEV1/FVC ratio: 74 %
Pre FEV6/FVC Ratio: 99 %
RV % pred: 115 %
RV: 2.73 L
TLC % pred: 96 %
TLC: 6.93 L

## 2022-01-26 NOTE — Progress Notes (Signed)
Full PFT Performed Today  

## 2022-01-26 NOTE — Patient Instructions (Signed)
Full PFT Performed Today  

## 2022-01-30 ENCOUNTER — Other Ambulatory Visit: Payer: Self-pay | Admitting: *Deleted

## 2022-01-30 DIAGNOSIS — J069 Acute upper respiratory infection, unspecified: Secondary | ICD-10-CM | POA: Diagnosis not present

## 2022-01-30 DIAGNOSIS — R942 Abnormal results of pulmonary function studies: Secondary | ICD-10-CM

## 2022-01-31 ENCOUNTER — Telehealth: Payer: Self-pay | Admitting: Pharmacist

## 2022-01-31 MED ORDER — REPATHA SURECLICK 140 MG/ML ~~LOC~~ SOAJ: 1.0000 | SUBCUTANEOUS | 11 refills | Status: DC

## 2022-01-31 NOTE — Telephone Encounter (Signed)
Received [place] prior auth request from [place]. PA has been submitted Key: [id number].

## 2022-01-31 NOTE — Telephone Encounter (Signed)
Approved through 04/20/22, refill sent to pharmacy.

## 2022-02-01 DIAGNOSIS — R0982 Postnasal drip: Secondary | ICD-10-CM | POA: Diagnosis not present

## 2022-02-01 DIAGNOSIS — R0989 Other specified symptoms and signs involving the circulatory and respiratory systems: Secondary | ICD-10-CM | POA: Diagnosis not present

## 2022-02-01 DIAGNOSIS — R0602 Shortness of breath: Secondary | ICD-10-CM | POA: Diagnosis not present

## 2022-02-01 DIAGNOSIS — R059 Cough, unspecified: Secondary | ICD-10-CM | POA: Diagnosis not present

## 2022-02-16 DIAGNOSIS — R0602 Shortness of breath: Secondary | ICD-10-CM | POA: Diagnosis not present

## 2022-02-16 DIAGNOSIS — Z8719 Personal history of other diseases of the digestive system: Secondary | ICD-10-CM | POA: Diagnosis not present

## 2022-02-17 ENCOUNTER — Other Ambulatory Visit: Payer: Self-pay | Admitting: Oncology

## 2022-02-17 ENCOUNTER — Other Ambulatory Visit: Payer: Self-pay

## 2022-02-17 DIAGNOSIS — C9210 Chronic myeloid leukemia, BCR/ABL-positive, not having achieved remission: Secondary | ICD-10-CM

## 2022-02-17 DIAGNOSIS — R0602 Shortness of breath: Secondary | ICD-10-CM

## 2022-02-17 MED ORDER — DASATINIB 50 MG PO TABS: 50.0000 mg | ORAL_TABLET | Freq: Every day | ORAL | 11 refills | Status: DC

## 2022-02-20 ENCOUNTER — Encounter (HOSPITAL_BASED_OUTPATIENT_CLINIC_OR_DEPARTMENT_OTHER): Payer: Self-pay | Admitting: Pulmonary Disease

## 2022-02-20 ENCOUNTER — Telehealth: Payer: Self-pay | Admitting: Cardiovascular Disease

## 2022-02-20 ENCOUNTER — Ambulatory Visit (HOSPITAL_BASED_OUTPATIENT_CLINIC_OR_DEPARTMENT_OTHER): Payer: BC Managed Care – PPO | Admitting: Pulmonary Disease

## 2022-02-20 VITALS — BP 150/86 | HR 75 | Ht 70.0 in | Wt 219.0 lb

## 2022-02-20 DIAGNOSIS — R0609 Other forms of dyspnea: Secondary | ICD-10-CM | POA: Diagnosis not present

## 2022-02-20 MED ORDER — FLUTICASONE-SALMETEROL 115-21 MCG/ACT IN AERO: 2.0000 | INHALATION_SPRAY | Freq: Two times a day (BID) | RESPIRATORY_TRACT | 0 refills | Status: DC

## 2022-02-20 NOTE — Telephone Encounter (Signed)
Returned call to patient-patient states he received a call to schedule a stress test and he is unaware of this.   He states he was unable to do the PET stress due to cost but was unaware something different was ordered.    Advised nurse out of office today but will send message for her to follow up tomorrow.

## 2022-02-20 NOTE — Telephone Encounter (Signed)
Patient calling with questions/concerns with the the stress test that is needed. Please advise

## 2022-02-20 NOTE — Patient Instructions (Signed)
Shortness of breath, chest tightness, peripheral eosinophilia --START Advair 115-21 mcg TWO puffs in the morning and evening AS NEEDED --CONTINUE Albuterol AS NEEDED for shortness of breath or wheezing --Encourage daily walking 10,000 steps --Recommend aerobic exercise 30 mins 4-5 days a week --If you would like further work-up with CT scan we can discuss in future  Follow-up with me in January

## 2022-02-20 NOTE — Progress Notes (Signed)
Subjective:   PATIENT ID: Steven [name] DOB: 1960/07/19, MRN: [id number]  Chief Complaint  Patient presents with   Consult    Abnormal PFT    Reason for Visit: New consult   Ms. Steven [name] is a 61 year old [name] former smoker with CAD s/p cardiac stent, HTN, GERD, CKD stage II, CML, obesity who presents as a new consult.  He reports shortness of breath for the last two years. Owns 6 acres and able to mow his lawn. Still able to do this but much slower. Associated with some productive cough with chest congestion. No wheezing. Worsens with uphills and has to stop and catch his breath. Climbing ladders. He was recently hospitalized in October 2023 for GI bleed on Plavix. Found with esophagitis and gastric erosions. His cardiologist discontinued Plavix for anemia and patient is scheduled to follow-up with GI.  Social History: Former smoker. 30 pack-years. 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  Past Medical History:  Diagnosis Date   Chest pain    a. Cath 2007:  luminal irregs, normal LVF (Dr. Doylene Canard) - was placed on plavix afterwards.;   b.  ETT-MV 11/13:   mild inferior ischemia, EF 59%.   CKD (chronic kidney disease), stage II    CML (chronic myeloid leukemia) ([place])    a. Dx 2011 - followed @ [facility] - remission   Coronary artery disease    Hepatitis    IN THE 1980'S   Hyperlipidemia    a. Dx 2006   Hypertension    a. Dx 2006   Pneumonia    hx of PNA     Family History  Problem Relation Age of Onset   Heart attack Father        Alive @ 57 (stenting @ 44)   Other Mother        Alive & well @ 12   Other Brother        Alive @ 7     Social History   Occupational History   Not on file  Tobacco Use   Smoking status: Former    Packs/day: 1.00    Years:  30.00    Total pack years: 30.00    Types: Cigarettes    Quit date: 02/14/2005    Years since quitting: 17.0   Smokeless tobacco: Never   Tobacco comments:    smoked 1-1/12 ppd for better part of 30 years  Substance and Sexual Activity   Alcohol use: Yes    Comment: Rare drink   Drug use: No   Sexual activity: Not on file    Allergies  Allergen Reactions   Atorvastatin Other (See Comments)    myalgias   Rosuvastatin Other (See Comments)    myalgias     Outpatient Medications Prior to Visit  Medication Sig Dispense Refill   aspirin EC (BAYER ASPIRIN EC LOW DOSE) 81 MG tablet Take 81 mg by mouth daily.     colchicine 0.6 MG tablet Take 0.6 mg by mouth as needed (gout).     dasatinib (SPRYCEL) 50 MG tablet Take 1 tablet (50 mg total) by mouth daily. 30 tablet 11   Evolocumab (REPATHA SURECLICK) 086 MG/ML SOAJ Inject 140 mg into the skin every 14 (fourteen) days. 2 mL 11   metoprolol tartrate (LOPRESSOR) 50 MG tablet Take 1 tablet (50 mg total) by  mouth 2 (two) times daily. 180 tablet 1   nitroGLYCERIN (NITROSTAT) 0.4 MG SL tablet Place 1 tablet (0.4 mg total) under the tongue every 5 (five) minutes as needed for chest pain. 25 tablet 3   acetaminophen (TYLENOL) 500 MG tablet Take 500 mg by mouth as needed for headache. (Patient not taking: Reported on 02/20/2022)     ferrous sulfate 325 (65 FE) MG EC tablet Take 1 tablet (325 mg total) by mouth 2 (two) times daily. Take on an empty stomach with a cup of orange juice.  If you are unable to tolerate this you may eat afterwards. (Patient not taking: Reported on 02/20/2022) 60 tablet 1   pantoprazole (PROTONIX) 40 MG tablet Take 1 tablet (40 mg total) by mouth 2 (two) times daily before a meal for 30 days, THEN 1 tablet (40 mg total) daily. Use this prescription first. (Patient not taking: Reported on 02/20/2022) 60 tablet 0   No facility-administered medications prior to visit.    ROS   Objective:   Vitals:   02/20/22 0844  BP:  (!) 150/86  Pulse: 75  SpO2: 99%  Weight: 219 lb (99.3 kg)  Height: '5\' 10"'$  (1.778 m)   SpO2: 99 % O2 Device: None (Room air)  Physical Exam: General: Well-appearing, no acute distress HENT: [place], AT Eyes: EOMI, no scleral icterus Respiratory: Clear to auscultation bilaterally.  No crackles, wheezing or rales Cardiovascular: RRR, -M/R/G, no JVD Extremities:-Edema,-tenderness Neuro: AAO x4, CNII-XII grossly intact Psych: Normal mood, normal affect  Data Reviewed:  Imaging: CXR 09/04/21 - No acute abnormalities  PFT: 01/26/22 FVC 4.10 (84%) FEV1 3.07 (84%) Ratio 74  TLC 96% DLCO 69% Interpretation: No obstructive or restrictive defect is present. Partial bronchodilator response however not significant. Does not preclude benefit of inhalers. DLCO corrected is normal range for his anemia  Labs: CBC    Component Value Date/Time   WBC 5.5 12/29/2021 0325   RBC 3.15 (L) 12/29/2021 0325   HGB 9.6 (L) 12/29/2021 0325   HCT 28.3 (L) 12/29/2021 0325   PLT 186 12/29/2021 0325   MCV 89.8 12/29/2021 0325   MCH 30.5 12/29/2021 0325   MCHC 33.9 12/29/2021 0325   RDW 14.5 12/29/2021 0325   LYMPHSABS 1.4 12/29/2021 0325   MONOABS 0.4 12/29/2021 0325   EOSABS 0.5 12/29/2021 0325   BASOSABS 0.0 12/29/2021 0325   Absolute eos 12/29/21 -500     Assessment & Plan:   Discussion: 61 year old [name] former smoker with CAD s/p cardiac stent, HTN, GERD, CKD stage II, CML on dasatinib, obesity who presents as a new consult.  His shortness of breath could be recently worsened by his anemia. Plavix has been discontinued and currently only on ASA. However he has had long standing history of dyspnea and chest tightness before this and Hg was normal. His peripheral eosinophils are markedly elevated and range from [phone] since 2013. He may have an underlying asthma/allergy symptoms contributing especially with his environmental exposures as an Clinical biochemist, working in dust, Acupuncturist. He  prefers to minimize his medication regimen but is willing to trial ICS/LABA for any perceived benefit for possible asthma. Reviewed PFTs with partial bronchodilator response but does not preclude benefit of therapy. Discussed bronchodilator regimen and inhaler technique.  Shortness of breath, chest tightness, peripheral eosinophilia --START Advair 115-21 mcg TWO puffs in the morning and evening --CONTINUE Albuterol AS NEEDED for shortness of breath or wheezing --Encourage daily walking 10,000 steps --Recommend aerobic exercise 30 mins 4-5 days  a week --If you would like further work-up with CT scan we can discuss in future  Health Maintenance Immunization History  Administered Date(s) Administered   Influenza,inj,Quad PF,6+ Mos 12/15/2016   Moderna Sars-Covid-2 Vaccination 05/14/2019, 06/10/2019, 03/02/2020   Td 08/12/2014   CT Lung Screen - Not qualified. >15 years since he quit  No orders of the defined types were placed in this encounter.  Meds ordered this encounter  Medications   fluticasone-salmeterol (ADVAIR HFA) 115-21 MCG/ACT inhaler    Sig: Inhale 2 puffs into the lungs 2 (two) times daily.    Dispense:  1 each    Refill:  0    Return in about 1 month (around 03/23/2022).  I have spent a total time of 45-minutes on the day of the appointment reviewing prior documentation, coordinating care and discussing medical diagnosis and plan with the patient/family. Imaging, labs and tests included in this note have been reviewed and interpreted independently by me.  [place], MD [facility] 02/20/2022 9:47 AM  Office Number [phone]

## 2022-02-21 ENCOUNTER — Encounter (HOSPITAL_COMMUNITY): Payer: BC Managed Care – PPO

## 2022-02-21 NOTE — Telephone Encounter (Signed)
Called patient, advised that because of the cost of the CARDIAC PET we were unable to schedule, so received message from MD okay to order [place].   They will call him to set up an appointment, I will notify scheduling team.   Thanks!

## 2022-02-22 ENCOUNTER — Telehealth: Payer: Self-pay | Admitting: Pulmonary Disease

## 2022-02-23 NOTE — Telephone Encounter (Signed)
Please review

## 2022-02-23 NOTE — Telephone Encounter (Signed)
Could I please have a test script for pricing for the following:  Advair HFA 115 Advair Diskus 100 [place] 100 Symbicort 160 Dulera 100 Breo 100

## 2022-02-24 ENCOUNTER — Other Ambulatory Visit (HOSPITAL_COMMUNITY): Payer: Self-pay

## 2022-02-24 MED ORDER — BUDESONIDE-FORMOTEROL FUMARATE 160-4.5 MCG/ACT IN AERO: 2.0000 | INHALATION_SPRAY | Freq: Two times a day (BID) | RESPIRATORY_TRACT | 5 refills | Status: DC

## 2022-02-24 NOTE — Telephone Encounter (Signed)
Contacted patient. He is agreeable to Symbicort. Will call in to preferred pharmacy.

## 2022-02-24 NOTE — Telephone Encounter (Signed)
Covered medications are as follows for 30 day supplies: (pricing through [employer], prices may vary)  Brand Advair Diskus- $15.00 Advair HFA- $35.00 Brand Symbicort- $15.00 Dulera- $35.00 Breo- $35.00

## 2022-03-08 ENCOUNTER — Telehealth: Payer: Self-pay | Admitting: Cardiovascular Disease

## 2022-03-08 MED ORDER — METOPROLOL TARTRATE 50 MG PO TABS: 50.0000 mg | ORAL_TABLET | Freq: Two times a day (BID) | ORAL | 3 refills | Status: AC

## 2022-03-08 NOTE — Telephone Encounter (Signed)
*  STAT* If patient is at the pharmacy, call can be transferred to refill team.   1. Which medications need to be refilled? (please list name of each medication and dose if known) metoprolol tartrate (LOPRESSOR) 50 MG tablet   2. Which pharmacy/location (including street and city if local pharmacy) is medication to be sent to?  [facility] - [place], [place]    3. Do they need a 30 day or 90 day supply?  90 day

## 2022-03-24 DIAGNOSIS — H2513 Age-related nuclear cataract, bilateral: Secondary | ICD-10-CM | POA: Diagnosis not present

## 2022-03-24 DIAGNOSIS — H3563 Retinal hemorrhage, bilateral: Secondary | ICD-10-CM | POA: Diagnosis not present

## 2022-03-30 ENCOUNTER — Ambulatory Visit (HOSPITAL_BASED_OUTPATIENT_CLINIC_OR_DEPARTMENT_OTHER): Payer: BC Managed Care – PPO | Admitting: Pulmonary Disease

## 2022-04-05 ENCOUNTER — Other Ambulatory Visit: Payer: BC Managed Care – PPO

## 2022-04-07 ENCOUNTER — Inpatient Hospital Stay: Payer: BC Managed Care – PPO | Attending: Oncology

## 2022-04-07 ENCOUNTER — Other Ambulatory Visit: Payer: BC Managed Care – PPO

## 2022-04-07 ENCOUNTER — Other Ambulatory Visit: Payer: Self-pay | Admitting: Oncology

## 2022-04-07 DIAGNOSIS — C9210 Chronic myeloid leukemia, BCR/ABL-positive, not having achieved remission: Secondary | ICD-10-CM

## 2022-04-07 DIAGNOSIS — Z79899 Other long term (current) drug therapy: Secondary | ICD-10-CM | POA: Diagnosis not present

## 2022-04-07 LAB — CBC WITH DIFFERENTIAL (CANCER CENTER ONLY)
Abs Immature Granulocytes: 0.03 10*3/uL (ref 0.00–0.07)
Basophils Absolute: 0.1 10*3/uL (ref 0.0–0.1)
Basophils Relative: 1 %
Eosinophils Absolute: 0.5 10*3/uL (ref 0.0–0.5)
Eosinophils Relative: 7 %
HCT: 41.5 % (ref 39.0–52.0)
Hemoglobin: 14.2 g/dL (ref 13.0–17.0)
Immature Granulocytes: 0 %
Lymphocytes Relative: 33 %
Lymphs Abs: 2.3 10*3/uL (ref 0.7–4.0)
MCH: 28.1 pg (ref 26.0–34.0)
MCHC: 34.2 g/dL (ref 30.0–36.0)
MCV: 82 fL (ref 80.0–100.0)
Monocytes Absolute: 0.5 10*3/uL (ref 0.1–1.0)
Monocytes Relative: 7 %
Neutro Abs: 3.5 10*3/uL (ref 1.7–7.7)
Neutrophils Relative %: 52 %
Platelet Count: 253 10*3/uL (ref 150–400)
RBC: 5.06 MIL/uL (ref 4.22–5.81)
RDW: 14.7 % (ref 11.5–15.5)
WBC Count: 6.9 10*3/uL (ref 4.0–10.5)
nRBC: 0 % (ref 0.0–0.2)

## 2022-04-11 ENCOUNTER — Ambulatory Visit: Payer: BC Managed Care – PPO | Admitting: Oncology

## 2022-04-14 ENCOUNTER — Other Ambulatory Visit: Payer: Self-pay | Admitting: Oncology

## 2022-04-14 ENCOUNTER — Inpatient Hospital Stay: Payer: BC Managed Care – PPO | Attending: Oncology | Admitting: Oncology

## 2022-04-14 VITALS — BP 194/94 | HR 72 | Temp 98.8°F | Resp 18 | Ht 70.0 in | Wt 223.4 lb

## 2022-04-14 DIAGNOSIS — C9210 Chronic myeloid leukemia, BCR/ABL-positive, not having achieved remission: Secondary | ICD-10-CM

## 2022-04-14 LAB — BCR-ABL1, CML/ALL, PCR, QUANT: Interpretation (BCRAL):: NEGATIVE

## 2022-04-14 NOTE — Progress Notes (Signed)
[facility]  [address] [place],  [place]  [address] [phone]  Clinic Day: 04/14/2022  Referring physician: Mckinley Jewel, MD  HISTORY OF PRESENT ILLNESS:  The patient is a 62 y.o. 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  He has been on dasatinib 50 mg daily, from which he has had a complete hematologic, cytogenetic, and molecular response.  He comes in today for routine followup.  Since his last visit, the patient has been doing well.  He continues to tolerate his dasatinib therapy very well.  He denies having any systemic symptoms which concern him for his CML being refractory to his dasatinib.    PHYSICAL EXAM:  Blood pressure (!) 194/94, pulse 72, temperature 98.8 F (37.1 C), resp. rate 18, height '5\' 10"'$  (1.778 m), weight 223 lb 6.4 oz (101.3 kg), SpO2 95 %. Wt Readings from Last 3 Encounters:  04/14/22 223 lb 6.4 oz (101.3 kg)  02/20/22 219 lb (99.3 kg)  01/04/22 219 lb 12.8 oz (99.7 kg)   Body mass index is 32.05 kg/m. Performance status (ECOG): 0 - Asymptomatic Physical Exam Constitutional:      Appearance: Normal appearance. He is not ill-appearing.  HENT:     Mouth/Throat:     Mouth: Mucous membranes are moist.     Pharynx: Oropharynx is clear. No oropharyngeal exudate or posterior oropharyngeal erythema.  Cardiovascular:     Rate and Rhythm: Normal rate and regular rhythm.     Heart sounds: No murmur heard.    No friction rub. No gallop.  Pulmonary:     Effort: Pulmonary effort is normal. No respiratory distress.     Breath sounds: Normal breath sounds. No wheezing, rhonchi or rales.  Abdominal:     General: Bowel sounds are normal. There is no distension.     Palpations: Abdomen is soft. There is no mass.     Tenderness: There is no abdominal tenderness.  Musculoskeletal:        General: No swelling.     Right lower leg: No edema.     Left lower leg: No edema.  Lymphadenopathy:      Cervical: No cervical adenopathy.     Upper Body:     Right upper body: No supraclavicular or axillary adenopathy.     Left upper body: No supraclavicular or axillary adenopathy.     Lower Body: No right inguinal adenopathy. No left inguinal adenopathy.  Skin:    General: Skin is warm.     Coloration: Skin is not jaundiced.     Findings: No lesion or rash.  Neurological:     General: No focal deficit present.     Mental Status: He is alert and oriented to person, place, and time. Mental status is at baseline.  Psychiatric:        Mood and Affect: Mood normal.        Behavior: Behavior normal.        Thought Content: Thought content normal.     LABS:      Latest Ref Rng & Units 04/07/2022   11:37 AM 12/29/2021    3:25 AM 12/28/2021    3:21 AM  CBC  WBC 4.0 - 10.5 K/uL 6.9  5.5  6.0   Hemoglobin 13.0 - 17.0 g/dL 14.2  9.6  9.8   Hematocrit 39.0 - 52.0 % 41.5  28.3  29.3   Platelets 150 - 400 K/uL 253  186  197  Latest Ref Rng & Units 12/29/2021    3:25 AM 12/28/2021    3:21 AM 12/27/2021   11:02 AM  CMP  Glucose 70 - 99 mg/dL 100  88  97   BUN 8 - 23 mg/dL 17  21  34   Creatinine 0.61 - 1.24 mg/dL 1.22  1.25  1.33   Sodium 135 - 145 mmol/L 138  141  143   Potassium 3.5 - 5.1 mmol/L 3.9  4.2  4.4   Chloride 98 - 111 mmol/L 112  113  114   CO2 22 - 32 mmol/L '23  25  24   '$ Calcium 8.9 - 10.3 mg/dL 8.5  8.7  9.0   Total Protein 6.5 - 8.1 g/dL 5.5   6.1   Total Bilirubin 0.3 - 1.2 mg/dL 0.4   0.4   Alkaline Phos 38 - 126 U/L 51   43   AST 15 - 41 U/L 27   16   ALT 0 - 44 U/L 25   19     Latest Reference Range & Units 04/07/22 11:36  b2a2 transcript % Comment  b3a2 transcript % Comment  E1A2 Transcript % Comment  Interpretation (BCRAL):  Negative  Director Review [facility]):  Comment  BCR-ABL1, CML/ALL, PCR, QUANT  Rpt  Comment: NEGATIVE for the BCR-ABL1 e1a2 (p190), e13a2 (b2a2, p210) and e14a2  (b3a2, p210) fusion transcripts. These results do not rule out the   presence of rare BCR-ABL1 transcripts not detected by this assay.   ASSESSMENT & PLAN:  Assessment/Plan:  A 62 y.o. male with chronic myelogenous leukemia.  I am extremely pleased as his labs continue to show he is in a complete molecular response.  This reflects the continued efficacy of his dasatinib at 50 mg.  He will continue to take this medication at 50 mg daily.  As he is doing very well from a CML perspective, I will see him back in another 6 months for repeat clinical assessment.  The patient understands all the plans discussed today and is in agreement with them.    Ayvion Kavanagh Macarthur Critchley, MD

## 2022-04-18 ENCOUNTER — Other Ambulatory Visit (HOSPITAL_COMMUNITY): Payer: Self-pay

## 2022-04-20 ENCOUNTER — Telehealth: Payer: Self-pay

## 2022-04-20 NOTE — Telephone Encounter (Signed)
Pharmacy Patient Advocate Encounter  Prior Authorization for REPATHA 140 MG/ML INJ has been approved.    Effective dates: 04/18/22 through 04/16/22   Received notification from [facility] that prior authorization for REPATHA 140 MG/ML INJ is needed.    PA submitted on 04/18/22 Key [id number] Status is pending  Karie Soda, [place] Patient Advocate Specialist Direct Number: [phone] Fax: [phone]

## 2022-04-24 DIAGNOSIS — E669 Obesity, unspecified: Secondary | ICD-10-CM | POA: Diagnosis not present

## 2022-04-24 DIAGNOSIS — I10 Essential (primary) hypertension: Secondary | ICD-10-CM | POA: Diagnosis not present

## 2022-04-24 DIAGNOSIS — I2510 Atherosclerotic heart disease of native coronary artery without angina pectoris: Secondary | ICD-10-CM | POA: Diagnosis not present

## 2022-04-24 DIAGNOSIS — E782 Mixed hyperlipidemia: Secondary | ICD-10-CM | POA: Diagnosis not present

## 2022-05-31 ENCOUNTER — Telehealth: Payer: Self-pay | Admitting: Cardiovascular Disease

## 2022-05-31 NOTE — Telephone Encounter (Signed)
Sending to PharmD for medication management.

## 2022-05-31 NOTE — Telephone Encounter (Signed)
*  STAT* If patient is at the pharmacy, call can be transferred to refill team.   1. Which medications need to be refilled? (please list name of each medication and dose if known)   Evolocumab (REPATHA SURECLICK) [id number] MG/ML SOAJ   2. Which pharmacy/location (including street and city if local pharmacy) is medication to be sent to?  [facility], [place] [address]   3. Do they need a 30 day or 90 day supply?   30 day  Patient stated he wants refills for this medication to go to [facility] in [place].    Patient stated he has not had his last shot yet which was due last Saturday and wants to know if he should skip this shot and take the next shot in 2 weeks.

## 2022-06-01 MED ORDER — REPATHA SURECLICK 140 MG/ML ~~LOC~~ SOAJ: 1.0000 | SUBCUTANEOUS | 11 refills | Status: DC

## 2022-06-01 NOTE — Telephone Encounter (Signed)
If patient wants to stick with Saturday injections, he should give himself his shot this Saturday and then every 2 weeks starting from this Saturday.  Called pt and made him aware. Copay card info called into [facility] per pt request. He is switching to [facility]

## 2022-06-26 DIAGNOSIS — R062 Wheezing: Secondary | ICD-10-CM | POA: Diagnosis not present

## 2022-06-26 DIAGNOSIS — R059 Cough, unspecified: Secondary | ICD-10-CM | POA: Diagnosis not present

## 2022-06-26 DIAGNOSIS — D849 Immunodeficiency, unspecified: Secondary | ICD-10-CM | POA: Diagnosis not present

## 2022-06-26 DIAGNOSIS — I10 Essential (primary) hypertension: Secondary | ICD-10-CM | POA: Diagnosis not present

## 2022-06-26 DIAGNOSIS — R0989 Other specified symptoms and signs involving the circulatory and respiratory systems: Secondary | ICD-10-CM | POA: Diagnosis not present

## 2022-08-03 DIAGNOSIS — M25561 Pain in right knee: Secondary | ICD-10-CM | POA: Diagnosis not present

## 2022-08-10 DIAGNOSIS — M25561 Pain in right knee: Secondary | ICD-10-CM | POA: Diagnosis not present

## 2022-08-18 DIAGNOSIS — M1711 Unilateral primary osteoarthritis, right knee: Secondary | ICD-10-CM | POA: Diagnosis not present

## 2022-08-18 DIAGNOSIS — S83241A Other tear of medial meniscus, current injury, right knee, initial encounter: Secondary | ICD-10-CM | POA: Diagnosis not present

## 2022-08-24 DIAGNOSIS — J0100 Acute maxillary sinusitis, unspecified: Secondary | ICD-10-CM | POA: Diagnosis not present

## 2022-08-28 DIAGNOSIS — B9689 Other specified bacterial agents as the cause of diseases classified elsewhere: Secondary | ICD-10-CM | POA: Diagnosis not present

## 2022-08-28 DIAGNOSIS — M1711 Unilateral primary osteoarthritis, right knee: Secondary | ICD-10-CM | POA: Diagnosis not present

## 2022-08-28 DIAGNOSIS — I10 Essential (primary) hypertension: Secondary | ICD-10-CM | POA: Diagnosis not present

## 2022-08-28 DIAGNOSIS — J329 Chronic sinusitis, unspecified: Secondary | ICD-10-CM | POA: Diagnosis not present

## 2022-09-19 DIAGNOSIS — M1711 Unilateral primary osteoarthritis, right knee: Secondary | ICD-10-CM | POA: Diagnosis not present

## 2022-09-22 ENCOUNTER — Other Ambulatory Visit: Payer: Self-pay | Admitting: Pharmacist Clinician (PhC)/ Clinical Pharmacy Specialist

## 2022-09-22 MED ORDER — REPATHA SURECLICK 140 MG/ML ~~LOC~~ SOAJ: 1.0000 | SUBCUTANEOUS | 0 refills | Status: DC

## 2022-09-26 DIAGNOSIS — M1711 Unilateral primary osteoarthritis, right knee: Secondary | ICD-10-CM | POA: Diagnosis not present

## 2022-09-29 ENCOUNTER — Inpatient Hospital Stay: Payer: BC Managed Care – PPO | Attending: Oncology

## 2022-09-29 DIAGNOSIS — C9210 Chronic myeloid leukemia, BCR/ABL-positive, not having achieved remission: Secondary | ICD-10-CM

## 2022-09-29 DIAGNOSIS — Z79899 Other long term (current) drug therapy: Secondary | ICD-10-CM | POA: Insufficient documentation

## 2022-09-29 DIAGNOSIS — C9211 Chronic myeloid leukemia, BCR/ABL-positive, in remission: Secondary | ICD-10-CM | POA: Insufficient documentation

## 2022-09-29 LAB — CBC WITH DIFFERENTIAL (CANCER CENTER ONLY)
Abs Immature Granulocytes: 0.04 10*3/uL (ref 0.00–0.07)
Basophils Absolute: 0.1 10*3/uL (ref 0.0–0.1)
Basophils Relative: 1 %
Eosinophils Absolute: 0.4 10*3/uL (ref 0.0–0.5)
Eosinophils Relative: 6 %
HCT: 43 % (ref 39.0–52.0)
Hemoglobin: 15 g/dL (ref 13.0–17.0)
Immature Granulocytes: 1 %
Lymphocytes Relative: 28 %
Lymphs Abs: 2 10*3/uL (ref 0.7–4.0)
MCH: 29.9 pg (ref 26.0–34.0)
MCHC: 34.9 g/dL (ref 30.0–36.0)
MCV: 85.7 fL (ref 80.0–100.0)
Monocytes Absolute: 0.5 10*3/uL (ref 0.1–1.0)
Monocytes Relative: 7 %
Neutro Abs: 4.1 10*3/uL (ref 1.7–7.7)
Neutrophils Relative %: 57 %
Platelet Count: 264 10*3/uL (ref 150–400)
RBC: 5.02 MIL/uL (ref 4.22–5.81)
RDW: 14.6 % (ref 11.5–15.5)
WBC Count: 7.1 10*3/uL (ref 4.0–10.5)
nRBC: 0 % (ref 0.0–0.2)

## 2022-10-03 DIAGNOSIS — M1711 Unilateral primary osteoarthritis, right knee: Secondary | ICD-10-CM | POA: Diagnosis not present

## 2022-10-05 LAB — BCR-ABL1, CML/ALL, PCR, QUANT
E1A2 Transcript: <0.0032 %
Interpretation (BCRAL):: NEGATIVE
b2a2 transcript: <0.0032 %

## 2022-10-13 ENCOUNTER — Ambulatory Visit: Payer: BC Managed Care – PPO | Admitting: Oncology

## 2022-10-18 NOTE — Progress Notes (Deleted)
[facility]  [address] [place],  [place]  [address] [phone]  Clinic Day: 04/14/2022  Referring physician: Ollen Bowl, MD  HISTORY OF PRESENT ILLNESS:  The patient is a 62 y.o. 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  He has been on dasatinib 50 mg daily, from which he has had a complete hematologic, cytogenetic, and molecular response.  He comes in today for routine followup.  Since his last visit, the patient has been doing well.  He continues to tolerate his dasatinib therapy very well.  He denies having any systemic symptoms which concern him for his CML being refractory to his dasatinib.    PHYSICAL EXAM:  There were no vitals taken for this visit. Wt Readings from Last 3 Encounters:  04/14/22 223 lb 6.4 oz (101.3 kg)  02/20/22 219 lb (99.3 kg)  01/04/22 219 lb 12.8 oz (99.7 kg)   There is no height or weight on file to calculate BMI. Performance status (ECOG): 0 - Asymptomatic Physical Exam Constitutional:      Appearance: Normal appearance. He is not ill-appearing.  HENT:     Mouth/Throat:     Mouth: Mucous membranes are moist.     Pharynx: Oropharynx is clear. No oropharyngeal exudate or posterior oropharyngeal erythema.  Cardiovascular:     Rate and Rhythm: Normal rate and regular rhythm.     Heart sounds: No murmur heard.    No friction rub. No gallop.  Pulmonary:     Effort: Pulmonary effort is normal. No respiratory distress.     Breath sounds: Normal breath sounds. No wheezing, rhonchi or rales.  Abdominal:     General: Bowel sounds are normal. There is no distension.     Palpations: Abdomen is soft. There is no mass.     Tenderness: There is no abdominal tenderness.  Musculoskeletal:        General: No swelling.     Right lower leg: No edema.     Left lower leg: No edema.  Lymphadenopathy:     Cervical: No cervical adenopathy.     Upper Body:     Right upper body: No  supraclavicular or axillary adenopathy.     Left upper body: No supraclavicular or axillary adenopathy.     Lower Body: No right inguinal adenopathy. No left inguinal adenopathy.  Skin:    General: Skin is warm.     Coloration: Skin is not jaundiced.     Findings: No lesion or rash.  Neurological:     General: No focal deficit present.     Mental Status: He is alert and oriented to person, place, and time. Mental status is at baseline.  Psychiatric:        Mood and Affect: Mood normal.        Behavior: Behavior normal.        Thought Content: Thought content normal.     LABS:      Latest Ref Rng & Units 09/29/2022   11:47 AM 04/07/2022   11:37 AM 12/29/2021    3:25 AM  CBC  WBC 4.0 - 10.5 K/uL 7.1  6.9  5.5   Hemoglobin 13.0 - 17.0 g/dL 46.9  62.9  9.6   Hematocrit 39.0 - 52.0 % 43.0  41.5  28.3   Platelets 150 - 400 K/uL 264  253  186       Latest Ref Rng & Units 12/29/2021    3:25 AM 12/28/2021  3:21 AM 12/27/2021   11:02 AM  CMP  Glucose 70 - 99 mg/dL 098  88  97   BUN 8 - 23 mg/dL 17  21  34   Creatinine 0.61 - 1.24 mg/dL 1.19  1.47  8.29   Sodium 135 - 145 mmol/L 138  141  143   Potassium 3.5 - 5.1 mmol/L 3.9  4.2  4.4   Chloride 98 - 111 mmol/L 112  113  114   CO2 22 - 32 mmol/L 23  25  24    Calcium 8.9 - 10.3 mg/dL 8.5  8.7  9.0   Total Protein 6.5 - 8.1 g/dL 5.5   6.1   Total Bilirubin 0.3 - 1.2 mg/dL 0.4   0.4   Alkaline Phos 38 - 126 U/L 51   43   AST 15 - 41 U/L 27   16   ALT 0 - 44 U/L 25   19     Latest Reference Range & Units 09/29/22 11:46  b2a2 transcript % <0.0032 %  b3a2 transcript % <0.0032 %  E1A2 Transcript % <0.0032 %  Interpretation (BCRAL):  Negative  Director Review [facility]):  Comment  Comment: (NOTE) This assay can detect three different types of BCR-ABL1 fusion transcripts associated with CML, ALL, and AML: e13a2 (previously b2a2) and e14a2 (previously b3a2) (major breakpoint, p210), as well as e1a2 (minor breakpoint, p190). The  e13a2 and e14a2 transcript values are titrated to the current International Scale (IS). The standardized baseline is 100% BCR-ABL1 (IS) and major molecular response (MMR) is equivalent to 0.1% BCR-ABL1 (IS) corresponding to a 3-log reduction. Results should be correlated with appropriate clinical and laboratory information as indicated.   ASSESSMENT & PLAN:  Assessment/Plan:  A 62 y.o. male with chronic myelogenous leukemia.  I am extremely pleased as his labs continue to show he is in a complete molecular response.  This reflects the continued efficacy of his dasatinib at 50 mg.  He will continue to take this medication at 50 mg daily.  As he is doing very well from a CML perspective, I will see him back in another 6 months for repeat clinical assessment.  The patient understands all the plans discussed today and is in agreement with them.     Kirby Funk, MD

## 2022-10-19 ENCOUNTER — Inpatient Hospital Stay: Payer: BC Managed Care – PPO | Admitting: Oncology

## 2022-10-24 DIAGNOSIS — Z86010 Personal history of colonic polyps: Secondary | ICD-10-CM | POA: Diagnosis not present

## 2022-10-24 DIAGNOSIS — R0602 Shortness of breath: Secondary | ICD-10-CM | POA: Diagnosis not present

## 2022-10-24 DIAGNOSIS — Z8679 Personal history of other diseases of the circulatory system: Secondary | ICD-10-CM | POA: Diagnosis not present

## 2022-10-24 DIAGNOSIS — Z8719 Personal history of other diseases of the digestive system: Secondary | ICD-10-CM | POA: Diagnosis not present

## 2022-11-01 DIAGNOSIS — Z0000 Encounter for general adult medical examination without abnormal findings: Secondary | ICD-10-CM | POA: Diagnosis not present

## 2022-11-01 DIAGNOSIS — Z125 Encounter for screening for malignant neoplasm of prostate: Secondary | ICD-10-CM | POA: Diagnosis not present

## 2022-11-01 DIAGNOSIS — E782 Mixed hyperlipidemia: Secondary | ICD-10-CM | POA: Diagnosis not present

## 2022-11-01 DIAGNOSIS — I10 Essential (primary) hypertension: Secondary | ICD-10-CM | POA: Diagnosis not present

## 2022-11-01 DIAGNOSIS — I2510 Atherosclerotic heart disease of native coronary artery without angina pectoris: Secondary | ICD-10-CM | POA: Diagnosis not present

## 2023-02-04 NOTE — Progress Notes (Unsigned)
Cardiology Office Note:  .   Date:  02/05/2023  ID:  Steven [name], DOB 10/23/1960, MRN [id number] PCP: Ollen Bowl, MD  [facility] Providers Cardiologist:  Reatha Harps, MD { History of Present Illness: .   Steven [name] is a 62 y.o. male with history of CAD, HLD, HTN who presents for follow-up.   History of Present Illness   Mr. Steven [name], a 62 year old male with a history of CAD status post PCI to the mid circumflex and mid LAD, presents for follow-up. He reports a new onset of discomfort on the left side of his chest that radiates down his arm. He denies any previous similar episodes. He also reports shortness of breath with activity, such as walking around [place] property. He has noticed that his blood pressure feels high, with a sensation in his face, but has not checked it at home in the past three days. His most recent LDL cholesterol was 61. He is also in remission from [facility] and is on lifelong medication for it. He is scheduled for a colonoscopy and endoscopy on December 19th.          Problem List NSTEMI/CAD -02/03/2021 -PCI to mid LCX/mid LAD 2. HLD -T chol 128, HDL 46, LDL 61, TG 119 3. HTN 4. CML 5. Minimal airway obstruction    ROS: All other ROS reviewed and negative. Pertinent positives noted in the HPI.     Studies Reviewed: Marland Kitchen   EKG Interpretation Date/Time:  Monday February 05 2023 14:45:50 EST Ventricular Rate:  69 PR Interval:  164 QRS Duration:  100 QT Interval:  402 QTC Calculation: 430 R Axis:   -30  Text Interpretation: Normal sinus rhythm Left axis deviation Incomplete right bundle branch block Confirmed by Lennie Odor [id number]) on 02/05/2023 2:55:13 PM   Physical Exam:   VS:  BP (!) 146/80 (BP Location: Left Arm, Patient Position: Sitting, Cuff Size: Large)   Pulse 70   Ht 5\' 11"  (1.803 m)   Wt 228 lb (103.4 kg)   SpO2 95%   BMI 31.80 kg/m    Wt Readings from Last 3 Encounters:  02/05/23 228 lb (103.4 kg)   04/14/22 223 lb 6.4 oz (101.3 kg)  02/20/22 219 lb (99.3 kg)    GEN: Well nourished, well developed in no acute distress NECK: No JVD; No carotid bruits CARDIAC: RRR, no murmurs, rubs, gallops RESPIRATORY:  Clear to auscultation without rales, wheezing or rhonchi  ABDOMEN: Soft, non-tender, non-distended EXTREMITIES:  No edema; No deformity  ASSESSMENT AND PLAN: .   Assessment and Plan    Coronary Artery Disease (CAD) History of PCI to mid circumflex and mid LAD in 2022. Reports new onset chest discomfort and shortness of breath with activity. EKG normal today. -Schedule Lexi scan nuclear medicine stress test for further evaluation. -continue ASA and repatha. On metoprolol.   Hypertension Blood pressure elevated today at 146/80. Patient on Metoprolol Tartrate 50mg  BID and Hydrochlorothiazide 12.5mg  daily. -Advise patient to keep daily blood pressure log. -Plan for blood pressure check in 3 months.  Hyperlipidemia Well controlled on Repatha. Most recent LDL 61. -Continue Repatha.  General Health Maintenance -Continue Aspirin 81mg  daily. -Follow-up visit in 1 year.          Informed Consent   Shared Decision Making/Informed Consent The risks [chest pain, shortness of breath, cardiac arrhythmias, dizziness, blood pressure fluctuations, myocardial infarction, stroke/transient ischemic attack, nausea, vomiting, allergic reaction, radiation exposure, metallic taste sensation and life-threatening complications (estimated  to be 1 in 10,000)], benefits (risk stratification, diagnosing coronary artery disease, treatment guidance) and alternatives of a nuclear stress test were discussed in detail with Mr. Moron and he agrees to proceed.      Follow-up: Return in about 3 months (around 05/08/2023).  Time Spent with Patient: I have spent a total of 35 minutes caring for this patient today face to face, ordering and reviewing labs/tests, reviewing prior records/medical history, examining  the patient, establishing an assessment and plan, communicating results/findings to the patient/family, and documenting in the medical record.   Signed, Lenna Gilford. Flora Lipps, MD, [facility]  [facility]  [address], [address] [place], [place] [address] [phone]  3:40 PM

## 2023-02-05 ENCOUNTER — Encounter: Payer: Self-pay | Admitting: Cardiovascular Disease

## 2023-02-05 ENCOUNTER — Telehealth: Payer: Self-pay | Admitting: Cardiovascular Disease

## 2023-02-05 ENCOUNTER — Ambulatory Visit: Payer: BC Managed Care – PPO | Attending: Cardiovascular Disease | Admitting: Cardiovascular Disease

## 2023-02-05 VITALS — BP 146/80 | HR 70 | Ht 71.0 in | Wt 228.0 lb

## 2023-02-05 DIAGNOSIS — I10 Essential (primary) hypertension: Secondary | ICD-10-CM

## 2023-02-05 DIAGNOSIS — I2510 Atherosclerotic heart disease of native coronary artery without angina pectoris: Secondary | ICD-10-CM | POA: Diagnosis not present

## 2023-02-05 DIAGNOSIS — R0602 Shortness of breath: Secondary | ICD-10-CM | POA: Diagnosis not present

## 2023-02-05 DIAGNOSIS — E782 Mixed hyperlipidemia: Secondary | ICD-10-CM | POA: Diagnosis not present

## 2023-02-05 NOTE — Patient Instructions (Signed)
Medication Instructions:  Your physician recommends that you continue on your current medications as directed. Please refer to the Current Medication list given to you today.    *If you need a refill on your cardiac medications before your next appointment, please call your pharmacy*   Lab Work: None    If you have labs (blood work) drawn today and your tests are completely normal, you will receive your results only by: MyChart Message (if you have MyChart) OR A paper copy in the mail If you have any lab test that is abnormal or we need to change your treatment, we will call you to review the results.   Testing/Procedures: Dr. Flora Lipps has ordered a Lexiscan Myocardial Perfusion Imaging Study.  Please arrive 15 minutes prior to your appointment time for registration and insurance purposes.   The test will take approximately 3 to 4 hours to complete; you may bring reading material.  If someone comes with you to your appointment, they will need to remain in the main lobby due to limited space in the testing area. **If you are pregnant or breastfeeding, please notify the nuclear lab prior to your appointment**   How to prepare for your Myocardial Perfusion Test: Do not eat or drink 3 hours prior to your test, except you may have water. Do not consume products containing caffeine (regular or decaffeinated) 12 hours prior to your test. (ex: coffee, chocolate, sodas, tea). Do wear comfortable clothes (no dresses or overalls) and walking shoes, tennis shoes preferred (No heels or open toe shoes are allowed). Do NOT wear cologne, perfume, aftershave, or lotions (deodorant is allowed). If you use an inhaler, use it the AM of your test and bring it with you.  If you use a nebulizer, use it the AM of your test.  If these instructions are not followed, your test will have to be rescheduled.    Follow-Up: At [facility], you and your health needs are our priority.  As part of our continuing  mission to provide you with exceptional heart care, we have created designated Provider Care Teams.  These Care Teams include your primary Cardiologist (physician) and Advanced Practice Providers (APPs -  Physician Assistants and Nurse Practitioners) who all work together to provide you with the care you need, when you need it.  We recommend signing up for the patient portal called "MyChart".  Sign up information is provided on this After Visit Summary.  MyChart is used to connect with patients for Virtual Visits (Telemedicine).  Patients are able to view lab/test results, encounter notes, upcoming appointments, etc.  Non-urgent messages can be sent to your provider as well.   To learn more about what you can do with MyChart, go to [email or web address].    Your next appointment:   3 month(s)  The format for your next appointment:   In Person  Provider:   Edd Fabian, FNP, Micah Flesher, PA-C, Marjie Skiff, PA-C, Robet Leu, PA-C, Juanda Crumble, PA-C, Joni Reining, DNP, ANP, Azalee Course, PA-C, or Bernadene Person, NP    Then, Reatha Harps, MD will plan to see you again in 1 year(s).   Other Instructions

## 2023-02-05 NOTE — Telephone Encounter (Signed)
Patient cancelled stress test because he states that he is not paying $3400. He wanted me to let Dr. Flora Lipps know, just FYI.

## 2023-02-06 ENCOUNTER — Other Ambulatory Visit: Payer: Self-pay

## 2023-02-06 ENCOUNTER — Other Ambulatory Visit: Payer: Self-pay | Admitting: Cardiovascular Disease

## 2023-02-06 ENCOUNTER — Telehealth (HOSPITAL_COMMUNITY): Payer: Self-pay | Admitting: Cardiovascular Disease

## 2023-02-06 DIAGNOSIS — R0602 Shortness of breath: Secondary | ICD-10-CM

## 2023-02-06 NOTE — Telephone Encounter (Signed)
Patient called and cancelled Myoview scheduled per Dr. Bufford Buttner for reason below:  02/05/2023 4:02 PM QI:[id number], LEAH  Cancel Rsn: Financial (patient states that he is not paying $3400)  Order will be removed from the [facility] WQ. Thank you.

## 2023-02-06 NOTE — Addendum Note (Signed)
Addended by: Sande Rives on: 02/06/2023 09:19 PM   Modules accepted: Orders

## 2023-02-06 NOTE — Addendum Note (Signed)
Addended by: Jonah Blue on: 02/06/2023 03:25 PM   Modules accepted: Orders

## 2023-02-12 ENCOUNTER — Other Ambulatory Visit: Payer: Self-pay

## 2023-02-12 ENCOUNTER — Other Ambulatory Visit (HOSPITAL_COMMUNITY): Payer: Self-pay

## 2023-02-12 DIAGNOSIS — C9210 Chronic myeloid leukemia, BCR/ABL-positive, not having achieved remission: Secondary | ICD-10-CM

## 2023-02-12 MED ORDER — DASATINIB 50 MG PO TABS: 50.0000 mg | ORAL_TABLET | Freq: Every day | ORAL | 11 refills | Status: DC

## 2023-03-19 ENCOUNTER — Ambulatory Visit (HOSPITAL_COMMUNITY): Payer: BC Managed Care – PPO

## 2023-04-20 DIAGNOSIS — M25572 Pain in left ankle and joints of left foot: Secondary | ICD-10-CM | POA: Diagnosis not present

## 2023-04-27 ENCOUNTER — Other Ambulatory Visit (HOSPITAL_COMMUNITY): Payer: Self-pay

## 2023-05-01 ENCOUNTER — Other Ambulatory Visit (HOSPITAL_COMMUNITY): Payer: Self-pay

## 2023-05-01 ENCOUNTER — Telehealth: Payer: Self-pay | Admitting: Pharmacy Technician

## 2023-05-01 NOTE — Telephone Encounter (Signed)
Pharmacy Patient Advocate Encounter   Received notification from Fax that prior authorization for repatha is required/requested.   Insurance verification completed.   The patient is insured through [facility] .   Per test claim: PA required; PA submitted to above mentioned insurance via CoverMyMeds Key/confirmation #/EOC [id number] Status is pending

## 2023-05-02 ENCOUNTER — Other Ambulatory Visit (HOSPITAL_COMMUNITY): Payer: Self-pay

## 2023-05-02 DIAGNOSIS — M25572 Pain in left ankle and joints of left foot: Secondary | ICD-10-CM | POA: Diagnosis not present

## 2023-05-02 NOTE — Telephone Encounter (Signed)
Willette Cluster, CPhT  You11 minutes ago (10:15 AM)    That was just the copay it gave [place] when we ran the test claim here with the note it may be different at other pharmacies. His pharmacy is [facility] in [place], [place] and their copay there may be the 15.00 or he may need to get a coupon at [email or web address] to make it the 15.00.   Noted  Will await for patient to return call

## 2023-05-02 NOTE — Telephone Encounter (Addendum)
Patient identification verified by 2 forms. Marilynn Rail, RN    Called and spoke to patient  Informed patient:   -PA for repatha approved   -cost of Rx will be $35  Patient states:   -cost should be $15   -currently at an appointment, will call at a later time  Informed patient message sent to pharmacy for assistance

## 2023-05-02 NOTE — Telephone Encounter (Signed)
Pharmacy Patient Advocate Encounter  Received notification from [facility] that Prior Authorization for repatha has been APPROVED from 05/01/23 to 04/30/24. Ran test claim, Copay is $35.00. This test claim was processed through [facility]- copay amounts may vary at other pharmacies due to pharmacy/plan contracts, or as the patient moves through the different stages of their insurance plan.   PA #/Case ID/Reference #: [id number]

## 2023-05-03 NOTE — Progress Notes (Signed)
 Cardiology Clinic Note   Patient Name: Steven [name] Date of Encounter: 05/07/2023  Primary Care Provider:  Ollen Bowl, MD Primary Cardiologist:  Reatha Harps, MD  Patient Profile    Steven [name] 63 year old male presents the clinic today for follow-up evaluation of his coronary artery disease and essential hypertension.  Past Medical History    Past Medical History:  Diagnosis Date   Chest pain    a. Cath 2007:  luminal irregs, normal LVF (Dr. Algie Coffer) - was placed on plavix afterwards.;   b.  ETT-MV 11/13:   mild inferior ischemia, EF 59%.   CKD (chronic kidney disease), stage II    CML (chronic myeloid leukemia) (HCC)    a. Dx 2011 - followed @ [facility] - remission   Coronary artery disease    Hepatitis    IN THE 1980'S   Hyperlipidemia    a. Dx 2006   Hypertension    a. Dx 2006   Pneumonia    hx of PNA   Past Surgical History:  Procedure Laterality Date   BIOPSY  12/28/2021   Procedure: BIOPSY;  Surgeon: Kathi Der, MD;  Location: [facility];  Service: Gastroenterology;;   CARDIAC CATHETERIZATION  02/16/2012   30% tubular prox RCA stenosis, normal coronaries otherwise; LVEF 65%, no WMAs   CORONARY/GRAFT ACUTE MI REVASCULARIZATION N/A 02/03/2021   Procedure: Coronary/Graft Acute MI Revascularization;  Surgeon: Kathleene Hazel, MD;  Location: [facility];  Service: Cardiovascular;  Laterality: N/A;   ESOPHAGOGASTRODUODENOSCOPY N/A 12/28/2021   Procedure: ESOPHAGOGASTRODUODENOSCOPY (EGD);  Surgeon: Kathi Der, MD;  Location: [facility];  Service: Gastroenterology;  Laterality: N/A;   LEFT HEART CATH AND CORONARY ANGIOGRAPHY N/A 02/03/2021   Procedure: LEFT HEART CATH AND CORONARY ANGIOGRAPHY;  Surgeon: Kathleene Hazel, MD;  Location: [facility] CV [facility];  Service: Cardiovascular;  Laterality: N/A;   LEFT HEART CATHETERIZATION WITH CORONARY ANGIOGRAM N/A 02/16/2012   Procedure: LEFT HEART  CATHETERIZATION WITH CORONARY ANGIOGRAM;  Surgeon: Wendall Stade, MD;  Location: [facility] CATH LAB;  Service: Cardiovascular;  Laterality: N/A;   Left Rotator Cuff Repair     a. approx. 2003    Allergies  Allergies  Allergen Reactions   Atorvastatin Other (See Comments)    myalgias   Rosuvastatin Other (See Comments)    myalgias    History of Present Illness    Steven [name] has a PMH of coronary artery disease, hypertension, hyperlipidemia.  He is post PCI to his mid circumflex and mid LAD.  NSTEMI/CAD 02/03/2021, PMH also includes CML.  He was seen in follow-up by Dr. Flora Lipps on 02/05/2023.  He was reporting new chest discomfort on the left side of his chest which radiated down his arm.  He denied previous or similar episodes.  He also reported shortness of breath with activity such as walking around his property.  [place].  He noticed that his blood pressure felt high.  He noted a sensation in his face but was not checking his blood pressure at home regularly.  His LDL cholesterol was noted to be 61.  He was in remission from [facility] and is on lifelong medication for CML.  He was scheduled for colonoscopy and endoscopy 03/01/2023.  Coronary artery disease-symptoms of angina both at rest and with physical activity.  Underwent echocardiogram on 02/04/2021 which showed normal LVEF and normal diastolic parameters.  When he was seen by Dr. Flora Lipps in November nuclear stress testing  was ordered but has not been completed.  His EKG was normal at that time.  He was continued on aspirin, Repatha, and metoprolol.  His blood pressure was slightly elevated at 146/80.  He was continued on metoprolol and hydrochlorothiazide.  He was instructed to keep a blood pressure log.  Blood pressure check was planned for 3 months.  Follow-up was planned for 1 year.  He presents to the clinic today for follow-up evaluation and states he has been noticing some shortness of breath intermittently.  He  continues to be very physically active working 40 hours/week.  He did note that he had an episode of shortness of breath and lightheadedness this morning.  He reports increased urination with increased HCTZ that his PCP has placed him on.  His blood pressure this morning is 122/74.  We reviewed stress testing recommendations.  He notes that he would not be able to pay for stress testing until he is placed on Medicare.  He plans to undergo stress testing in June.  We reviewed his previous cardiac catheterization and his medications.  He expressed understanding.  His LDL cholesterol is 61 on last check.  I will prescribe Imdur 15 mg daily, have him increase his p.o. hydration, and plan follow-up in 2 to 3 months.  Today he denies chest pain, lower extremity edema, fatigue, palpitations, melena, hematuria, hemoptysis, diaphoresis, weakness, presyncope, syncope, orthopnea, and PND.   Home Medications    Prior to Admission medications   Medication Sig Start Date End Date Taking? Authorizing Provider  acetaminophen (TYLENOL) 500 MG tablet Take 500 mg by mouth as needed for headache.    [provider]  aspirin EC (BAYER ASPIRIN EC LOW DOSE) 81 MG tablet Take 81 mg by mouth daily.    [provider]  budesonide-formoterol (SYMBICORT) 160-4.5 MCG/ACT inhaler Inhale 2 puffs into the lungs in the morning and at bedtime. 02/24/22   Luciano Cutter, MD  colchicine 0.6 MG tablet Take 0.6 mg by mouth as needed (gout).    [provider]  dasatinib (SPRYCEL) 50 MG tablet Take 1 tablet (50 mg total) by mouth daily. 02/12/23   Mauro Kaufmann, NP  Evolocumab (REPATHA SURECLICK) 140 MG/ML SOAJ Inject 140 mg into the skin every 14 (fourteen) days. 09/22/22   O'NealRonnald Ramp, MD  ferrous sulfate 325 (65 FE) MG EC tablet Take 1 tablet (325 mg total) by mouth 2 (two) times daily. Take on an empty stomach with a cup of orange juice.  If you are unable to tolerate this you may eat  afterwards. Patient not taking: Reported on 02/20/2022 12/29/21 02/27/22  Marinda Elk, MD  hydrochlorothiazide (HYDRODIURIL) 12.5 MG tablet Take 12.5 mg by mouth daily.    [provider]  metoprolol tartrate (LOPRESSOR) 50 MG tablet Take 1 tablet (50 mg total) by mouth 2 (two) times daily. 03/08/22   O'Neal, Ronnald Ramp, MD  nitroGLYCERIN (NITROSTAT) 0.4 MG SL tablet Place 1 tablet (0.4 mg total) under the tongue every 5 (five) minutes as needed for chest pain. 02/05/21   Creig Hines, NP  pantoprazole (PROTONIX) 40 MG tablet Take 1 tablet (40 mg total) by mouth 2 (two) times daily before a meal for 30 days, THEN 1 tablet (40 mg total) daily. Use this prescription first. Patient not taking: Reported on 02/20/2022 12/29/21 03/29/22  Marinda Elk, MD    Family History    Family History  Problem Relation Age of Onset   Heart attack Father  Alive @ 77 (stenting @ 76)   Other Mother        Alive & well @ 66   Other Brother        Alive @ 38   He indicated that the status of his mother is unknown. He indicated that the status of his father is unknown. He indicated that the status of his brother is unknown.  Social History    Social History   Socioeconomic History   Marital status: Divorced    Spouse name: Not on file   Number of children: Not on file   Years of education: Not on file   Highest education level: Not on file  Occupational History   Not on file  Tobacco Use   Smoking status: Former    Current packs/day: 0.00    Average packs/day: 1 pack/day for 30.0 years (30.0 ttl pk-yrs)    Types: Cigarettes    Start date: 02/15/1975    Quit date: 02/14/2005    Years since quitting: 18.2   Smokeless tobacco: Never   Tobacco comments:    smoked 1-1/12 ppd for better part of 30 years  Substance and Sexual Activity   Alcohol use: Yes    Comment: Rare drink   Drug use: No   Sexual activity: Not on file  Other Topics Concern   Not on file   Social History Narrative   Lives in [place] by himself.  Works as an Personnel officer.   Social Drivers of Corporate investment banker Strain: Not on file  Food Insecurity: No Food Insecurity (12/27/2021)   Hunger Vital Sign    Worried About Running Out of Food in the Last Year: Never true    Ran Out of Food in the Last Year: Never true  Transportation Needs: No Transportation Needs (12/27/2021)   PRAPARE - Administrator, Civil Service (Medical): No    Lack of Transportation (Non-Medical): No  Physical Activity: Not on file  Stress: Not on file  Social Connections: Not on file  Intimate Partner Violence: Not At Risk (12/27/2021)   Humiliation, Afraid, Rape, and Kick questionnaire    Fear of Current or Ex-Partner: No    Emotionally Abused: No    Physically Abused: No    Sexually Abused: No     Review of Systems    General:  No chills, fever, night sweats or weight changes.  Cardiovascular:  No chest pain, dyspnea on exertion, edema, orthopnea, palpitations, paroxysmal nocturnal dyspnea. Dermatological: No rash, lesions/masses Respiratory: No cough, dyspnea Urologic: No hematuria, dysuria Abdominal:   No nausea, vomiting, diarrhea, bright red blood per rectum, melena, or hematemesis Neurologic:  No visual changes, wkns, changes in mental status. All other systems reviewed and are otherwise negative except as noted above.  Physical Exam    VS:  BP 122/74 (BP Location: Left Arm, Patient Position: Sitting, Cuff Size: Large)   Pulse 64   Ht 5\' 11"  (1.803 m)   Wt 229 lb (103.9 kg)   SpO2 96%   BMI 31.94 kg/m  , BMI Body mass index is 31.94 kg/m. GEN: Well nourished, well developed, in no acute distress. HEENT: normal. Neck: Supple, no JVD, carotid bruits, or masses. Cardiac: RRR, no murmurs, rubs, or gallops. No clubbing, cyanosis, edema.  Radials/DP/PT 2+ and equal bilaterally.  Respiratory:  Respirations regular and unlabored, clear to auscultation  bilaterally. GI: Soft, nontender, nondistended, BS + x 4. MS: no deformity or atrophy. Skin: warm and dry, no  rash. Neuro:  Strength and sensation are intact. Psych: Normal affect.  Accessory Clinical Findings    Recent Labs: 09/29/2022: Hemoglobin 15.0; Platelet Count 264   Recent Lipid Panel    Component Value Date/Time   CHOL 125 07/21/2021 0811   TRIG 58 07/21/2021 0811   HDL 46 07/21/2021 0811   CHOLHDL 2.7 07/21/2021 0811   CHOLHDL 4.1 02/04/2021 0127   VLDL 28 02/04/2021 0127   LDLCALC 66 07/21/2021 0811         ECG personally reviewed by me today-none today.     Echocardiogram 02/04/2021 IMPRESSIONS     1. Left ventricular ejection fraction, by estimation, is 55 to 60%. The  left ventricle has normal function. The left ventricle has no regional  wall motion abnormalities. Left ventricular diastolic parameters were  normal.   2. Right ventricular systolic function is normal. The right ventricular  size is normal. There is normal pulmonary artery systolic pressure.   3. Left atrial size was moderately dilated.   4. The mitral valve is abnormal. Mild mitral valve regurgitation. No  evidence of mitral stenosis.   5. The aortic valve is tricuspid. There is mild calcification of the  aortic valve. Aortic valve regurgitation is not visualized. Aortic valve  sclerosis is present, with no evidence of aortic valve stenosis.   6. The inferior vena cava is normal in size with greater than 50%  respiratory variability, suggesting right atrial pressure of 3 mmHg.   FINDINGS   Left Ventricle: Left ventricular ejection fraction, by estimation, is 55  to 60%. The left ventricle has normal function. The left ventricle has no  regional wall motion abnormalities. The left ventricular internal cavity  size was normal in size. There is   no left ventricular hypertrophy. Left ventricular diastolic parameters  were normal.   Right Ventricle: The right ventricular size is  normal. No increase in  right ventricular wall thickness. Right ventricular systolic function is  normal. There is normal pulmonary artery systolic pressure. The tricuspid  regurgitant velocity is 2.25 m/s, and   with an assumed right atrial pressure of 3 mmHg, the estimated right  ventricular systolic pressure is 23.2 mmHg.   Left Atrium: Left atrial size was moderately dilated.   Right Atrium: Right atrial size was normal in size.   Pericardium: There is no evidence of pericardial effusion.   Mitral Valve: The mitral valve is abnormal. There is mild thickening of  the mitral valve leaflet(s). Mild mitral valve regurgitation. No evidence  of mitral valve stenosis.   Tricuspid Valve: The tricuspid valve is normal in structure. Tricuspid  valve regurgitation is not demonstrated. No evidence of tricuspid  stenosis.   Aortic Valve: The aortic valve is tricuspid. There is mild calcification  of the aortic valve. Aortic valve regurgitation is not visualized. Aortic  valve sclerosis is present, with no evidence of aortic valve stenosis.  Aortic valve mean gradient measures  5.0 mmHg. Aortic valve peak gradient measures 9.7 mmHg. Aortic valve area,  by VTI measures 3.04 cm.   Pulmonic Valve: The pulmonic valve was normal in structure. Pulmonic valve  regurgitation is not visualized. No evidence of pulmonic stenosis.   Aorta: The aortic root is normal in size and structure.   Venous: The inferior vena cava is normal in size with greater than 50%  respiratory variability, suggesting right atrial pressure of 3 mmHg.   IAS/Shunts: No atrial level shunt detected by color flow Doppler.       Assessment &  Plan   1.  Coronary artery disease-underwent LHC with PCI to his mid circumflex and mid LAD in 2022.  During last visit 11/24 nuclear stress testing was recommended but not completed.  He denies further episodes of angina both at rest and with exertion. Continue aspirin, Repatha,  metoprolol Heart healthy low-sodium diet Increase physical activity as tolerated Start Imdur 15 mg daily We will plan for stress testing around June or sooner if symptoms worsen.  Hyperlipidemia-LDL 61 on 11/01/2022.  Reports compliance with Repatha. High-fiber diet Continue Repatha Increase physical activity as tolerated  Essential hypertension-BP today 122/74. Low-sodium diet-salty 6 diet sheet given Maintain blood pressure log Continue metoprolol, hydrochlorothiazide  Disposition: Follow-up with Dr. Flora Lipps or me in 2-3 months.   Thomasene Ripple. Kelaiah Escalona NP-C     05/07/2023, 9:02 AM [facility] [address] Office [phone] Fax [phone]    I spent 14 minutes examining this patient, reviewing medications, and using patient centered shared decision making involving their cardiac care.   I spent  20 minutes reviewing past medical history,  medications, and prior cardiac tests.

## 2023-05-04 DIAGNOSIS — E782 Mixed hyperlipidemia: Secondary | ICD-10-CM | POA: Diagnosis not present

## 2023-05-04 DIAGNOSIS — I2510 Atherosclerotic heart disease of native coronary artery without angina pectoris: Secondary | ICD-10-CM | POA: Diagnosis not present

## 2023-05-04 DIAGNOSIS — I10 Essential (primary) hypertension: Secondary | ICD-10-CM | POA: Diagnosis not present

## 2023-05-04 DIAGNOSIS — E669 Obesity, unspecified: Secondary | ICD-10-CM | POA: Diagnosis not present

## 2023-05-07 ENCOUNTER — Ambulatory Visit: Payer: BC Managed Care – PPO | Attending: General Practice | Admitting: General Practice

## 2023-05-07 ENCOUNTER — Encounter: Payer: Self-pay | Admitting: General Practice

## 2023-05-07 VITALS — BP 122/74 | HR 64 | Ht 71.0 in | Wt 229.0 lb

## 2023-05-07 DIAGNOSIS — I10 Essential (primary) hypertension: Secondary | ICD-10-CM

## 2023-05-07 DIAGNOSIS — E782 Mixed hyperlipidemia: Secondary | ICD-10-CM | POA: Diagnosis not present

## 2023-05-07 DIAGNOSIS — I2510 Atherosclerotic heart disease of native coronary artery without angina pectoris: Secondary | ICD-10-CM

## 2023-05-07 MED ORDER — NITROGLYCERIN 0.4 MG SL SUBL: 0.4000 mg | SUBLINGUAL_TABLET | SUBLINGUAL | 3 refills | Status: DC | PRN

## 2023-05-07 MED ORDER — ISOSORBIDE MONONITRATE ER 30 MG PO TB24: 15.0000 mg | ORAL_TABLET | Freq: Every day | ORAL | 6 refills | Status: DC

## 2023-05-07 NOTE — Patient Instructions (Signed)
 Medication Instructions:  START IMDUR 15MG  DAILY (1/2 TAB) *If you need a refill on your cardiac medications before your next appointment, please call your pharmacy*  Lab Work: NONE If you have labs (blood work) drawn today and your tests are completely normal, you will receive your results only by:  MyChart Message (if you have MyChart) OR  A paper copy in the mail If you have any lab test that is abnormal or we need to change your treatment, we will call you to review the results.  Other Instructions MAINTAIN YOUR HYDRATION MAY DRINK ELECTROLYTE DRINKS IF YOUR DIZZINESS IS WORSE NOTIFY YOUR PRIMARY MD  TAKE AND LOG YOUR BLOOD PRESSURE AND HEART RATE DAILY-AFTER MEDICATION, AT LEAST 1-2 HOURS  Follow-Up: At [facility], you and your health needs are our priority.  As part of our continuing mission to provide you with exceptional heart care, we have created designated Provider Care Teams.  These Care Teams include your primary Cardiologist (physician) and Advanced Practice Providers (APPs -  Physician Assistants and Nurse Practitioners) who all work together to provide you with the care you need, when you need it.  Your next appointment:   2-3 month(s)  Provider:   Reatha Harps, MD  or Edd Fabian, FNP

## 2023-05-18 ENCOUNTER — Telehealth: Payer: Self-pay | Admitting: Cardiovascular Disease

## 2023-05-18 NOTE — Telephone Encounter (Signed)
 Pt c/o medication issue:  1. Name of Medication:   isosorbide mononitrate (IMDUR) 30 MG 24 hr tablet    2. How are you currently taking this medication (dosage and times per day)?  Take 0.5 tablets (15 mg total) by mouth daily.      3. Are you having a reaction (difficulty breathing--STAT)? No  4. What is your medication issue? Pt is requesting a callback regarding this medication giving him serious headaches. Please advise.

## 2023-05-18 NOTE — Telephone Encounter (Signed)
 Called and spoke to pt who states he is having severe headaches after taking the new medication, Imdur 15 mg once daily.  He has tried taking it at different times of the day.  He also is taking Tylenol, "like candy".  Verbalized frustration, and stated, "Honey, after today, I'm telling you I ain't taking any more of that."  Will forward message to NP for further recommendations.

## 2023-05-18 NOTE — Telephone Encounter (Signed)
 Ronney Asters, NP  Cv Div Nl [phone] minutes ago (2:49 PM)    May discontinue Imdur. If headaches continue pt should follow up with PCP. Please verify that pts blood pressure is well controled.  Thomasene Ripple. Cleaver NP-C  [image]   05/18/2023, 2:49 PM [facility] [address] Office [phone] Fax [phone]  Patient identification verified by 2 forms. Marilynn Rail, RN    Called and spoke to patient  Patient states:   -does not typically get headaches   -"I have been eating tylenol like candy"   -has been taking Imdur 30mg  (half tablet)   -Takes Metoprolol 50mg  BID and hydrochlorothiazide 25mg  daily   -3/7: 130/84 HR 67  -3/6: 144/88 HR 65 (yesterday afternoon) Took Imdur yesterday morning at 10:30am   -3/5: 143/96 HR (right after walking in the afternoon) Took Imdur at 8:30pm before bed  -3/4: 131/77 HR: 92 (4:30pm after work) Took Imdur in the morning at 4:30am   -3/3: 129/87 HR: 80 (in the afternoon after work)  took Imdur Before bed   -3/2: 138/88 HR: 78 (afternoon) took imdur before bed  Advised patient:   -Keep BP log and report log next week   -Check BP in morning 1-2 hours after Rx and in the evening  Patient agrees with plan, no questions at this time

## 2023-05-25 ENCOUNTER — Other Ambulatory Visit: Payer: Self-pay | Admitting: Cardiovascular Disease

## 2023-06-01 DIAGNOSIS — M25572 Pain in left ankle and joints of left foot: Secondary | ICD-10-CM | POA: Diagnosis not present

## 2023-06-28 ENCOUNTER — Emergency Department (HOSPITAL_COMMUNITY)
Admission: EM | Admit: 2023-06-28 | Discharge: 2023-06-29 | Disposition: A | Discharge status: Home / Self Care | Attending: Emergency Medicine | Admitting: Emergency Medicine

## 2023-06-28 ENCOUNTER — Emergency Department (HOSPITAL_COMMUNITY)

## 2023-06-28 ENCOUNTER — Other Ambulatory Visit: Payer: Self-pay

## 2023-06-28 DIAGNOSIS — R197 Diarrhea, unspecified: Secondary | ICD-10-CM | POA: Insufficient documentation

## 2023-06-28 DIAGNOSIS — R109 Unspecified abdominal pain: Secondary | ICD-10-CM | POA: Insufficient documentation

## 2023-06-28 DIAGNOSIS — R079 Chest pain, unspecified: Secondary | ICD-10-CM | POA: Insufficient documentation

## 2023-06-28 DIAGNOSIS — R1110 Vomiting, unspecified: Secondary | ICD-10-CM | POA: Insufficient documentation

## 2023-06-28 DIAGNOSIS — R1011 Right upper quadrant pain: Secondary | ICD-10-CM | POA: Diagnosis not present

## 2023-06-28 DIAGNOSIS — R0789 Other chest pain: Secondary | ICD-10-CM | POA: Diagnosis not present

## 2023-06-28 DIAGNOSIS — Z79899 Other long term (current) drug therapy: Secondary | ICD-10-CM | POA: Diagnosis not present

## 2023-06-28 DIAGNOSIS — I129 Hypertensive chronic kidney disease with stage 1 through stage 4 chronic kidney disease, or unspecified chronic kidney disease: Secondary | ICD-10-CM | POA: Insufficient documentation

## 2023-06-28 DIAGNOSIS — Z7982 Long term (current) use of aspirin: Secondary | ICD-10-CM | POA: Insufficient documentation

## 2023-06-28 DIAGNOSIS — I2510 Atherosclerotic heart disease of native coronary artery without angina pectoris: Secondary | ICD-10-CM | POA: Diagnosis not present

## 2023-06-28 DIAGNOSIS — R202 Paresthesia of skin: Secondary | ICD-10-CM | POA: Diagnosis not present

## 2023-06-28 DIAGNOSIS — N189 Chronic kidney disease, unspecified: Secondary | ICD-10-CM | POA: Diagnosis not present

## 2023-06-28 LAB — CBC WITH DIFFERENTIAL/PLATELET
Abs Immature Granulocytes: 0.06 10*3/uL (ref 0.00–0.07)
Basophils Absolute: 0.1 10*3/uL (ref 0.0–0.1)
Basophils Relative: 0 %
Eosinophils Absolute: 0.2 10*3/uL (ref 0.0–0.5)
Eosinophils Relative: 1 %
HCT: 49.2 % (ref 39.0–52.0)
Hemoglobin: 17.4 g/dL — ABNORMAL HIGH (ref 13.0–17.0)
Immature Granulocytes: 0 %
Lymphocytes Relative: 3 %
Lymphs Abs: 0.5 10*3/uL — ABNORMAL LOW (ref 0.7–4.0)
MCH: 30.7 pg (ref 26.0–34.0)
MCHC: 35.4 g/dL (ref 30.0–36.0)
MCV: 86.9 fL (ref 80.0–100.0)
Monocytes Absolute: 0.6 10*3/uL (ref 0.1–1.0)
Monocytes Relative: 4 %
Neutro Abs: 15.6 10*3/uL — ABNORMAL HIGH (ref 1.7–7.7)
Neutrophils Relative %: 92 %
Platelets: 279 10*3/uL (ref 150–400)
RBC: 5.66 MIL/uL (ref 4.22–5.81)
RDW: 14.7 % (ref 11.5–15.5)
WBC: 16.9 10*3/uL — ABNORMAL HIGH (ref 4.0–10.5)
nRBC: 0 % (ref 0.0–0.2)

## 2023-06-28 LAB — I-STAT CHEM 8, ED
BUN: 23 mg/dL (ref 8–23)
Calcium, Ion: 1.03 mmol/L — ABNORMAL LOW (ref 1.15–1.40)
Chloride: 106 mmol/L (ref 98–111)
Creatinine, Ser: 1.6 mg/dL — ABNORMAL HIGH (ref 0.61–1.24)
Glucose, Bld: 138 mg/dL — ABNORMAL HIGH (ref 70–99)
HCT: 49 % (ref 39.0–52.0)
Hemoglobin: 16.7 g/dL (ref 13.0–17.0)
Potassium: 4.3 mmol/L (ref 3.5–5.1)
Sodium: 137 mmol/L (ref 135–145)
TCO2: 21 mmol/L — ABNORMAL LOW (ref 22–32)

## 2023-06-28 LAB — TROPONIN I (HIGH SENSITIVITY)
Troponin I (High Sensitivity): 6 ng/L (ref <18)
Troponin I (High Sensitivity): 6 ng/L (ref <18)

## 2023-06-28 LAB — COMPREHENSIVE METABOLIC PANEL WITH GFR
ALT: 47 U/L — ABNORMAL HIGH (ref 0–44)
AST: 47 U/L — ABNORMAL HIGH (ref 15–41)
Albumin: 4.9 g/dL (ref 3.5–5.0)
Alkaline Phosphatase: 52 U/L (ref 38–126)
Anion gap: 17 — ABNORMAL HIGH (ref 5–15)
BUN: 20 mg/dL (ref 8–23)
CO2: 18 mmol/L — ABNORMAL LOW (ref 22–32)
Calcium: 10.2 mg/dL (ref 8.9–10.3)
Chloride: 103 mmol/L (ref 98–111)
Creatinine, Ser: 1.56 mg/dL — ABNORMAL HIGH (ref 0.61–1.24)
GFR, Estimated: 49 mL/min — ABNORMAL LOW (ref >60)
Glucose, Bld: 131 mg/dL — ABNORMAL HIGH (ref 70–99)
Potassium: 4.4 mmol/L (ref 3.5–5.1)
Sodium: 138 mmol/L (ref 135–145)
Total Bilirubin: 1.1 mg/dL (ref 0.0–1.2)
Total Protein: 8.2 g/dL — ABNORMAL HIGH (ref 6.5–8.1)

## 2023-06-28 LAB — RESP PANEL BY RT-PCR (RSV, FLU A&B, COVID)  RVPGX2
Influenza A by PCR: NEGATIVE
Influenza B by PCR: NEGATIVE
Resp Syncytial Virus by PCR: NEGATIVE
SARS Coronavirus 2 by RT PCR: NEGATIVE

## 2023-06-28 LAB — LIPASE, BLOOD: Lipase: 31 U/L (ref 11–51)

## 2023-06-28 MED ORDER — ASPIRIN 81 MG PO CHEW
324.0000 mg | CHEWABLE_TABLET | Freq: Once | ORAL | Status: AC
  Administered 2023-06-28: 324 mg via ORAL
  Filled 2023-06-28: qty 4

## 2023-06-28 MED ORDER — LACTATED RINGERS IV BOLUS
1000.0000 mL | Freq: Once | INTRAVENOUS | Status: AC
  Administered 2023-06-28: 1000 mL via INTRAVENOUS

## 2023-06-28 MED ORDER — HYDROCODONE-ACETAMINOPHEN 5-325 MG PO TABS
1.0000 | ORAL_TABLET | Freq: Once | ORAL | Status: AC
  Administered 2023-06-28: 1 via ORAL
  Filled 2023-06-28: qty 1

## 2023-06-28 MED ORDER — MORPHINE SULFATE (PF) 4 MG/ML IV SOLN
4.0000 mg | Freq: Once | INTRAVENOUS | Status: AC
  Administered 2023-06-28: 4 mg via INTRAVENOUS
  Filled 2023-06-28: qty 1

## 2023-06-28 MED ORDER — ONDANSETRON HCL 4 MG/2ML IJ SOLN
4.0000 mg | Freq: Once | INTRAMUSCULAR | Status: AC
  Administered 2023-06-28: 4 mg via INTRAVENOUS

## 2023-06-28 MED ORDER — ONDANSETRON 4 MG PO TBDP
4.0000 mg | ORAL_TABLET | Freq: Once | ORAL | Status: DC
  Filled 2023-06-28: qty 1

## 2023-06-28 NOTE — ED Provider Triage Note (Signed)
 Emergency Medicine Provider Triage Evaluation Note  Steven [name] , a 63 y.o. male  was evaluated in triage.  Pt complains of chest pain shaded with 5 episodes of vomiting and 2 episodes of diarrhea.  Has not noticed any blood in vomit or stool.  Pain is constant and worse when taking deep breath then he feels he is short of breath like he cannot take regular breaths in.  This feels like his prior cardiac events.    Review of Systems  Positive: Abd, cp, sob, nvd Negative: HA  Physical Exam  Ht 5\' 11"  (1.803 m)   Wt 103.9 kg   BMI 31.95 kg/m  Gen:   Awake, distress   Resp:  Normal effort  MSK:   Moves extremities without difficulty  Other:  DP and radial pulse are equal bilaterally.  He has reproducible tenderness to RUQ  Medical Decision Making  Medically screening exam initiated at 6:36 PM.  Appropriate orders placed.  Deborrah Fam was informed that the remainder of the evaluation will be completed by another provider, this initial triage assessment does not replace that evaluation, and the importance of remaining in the ED until their evaluation is complete.     Eudora Heron, PA-C 06/28/23 2536

## 2023-06-28 NOTE — ED Triage Notes (Signed)
 Pt began having right sided chest pain around 1300. Pain is "like real bad indigestion that radiates to back". Pain has gotten worse in the last hour. Pain is better when bending over and worse when palpated. Pt has SOB but states he always has SOB.

## 2023-06-28 NOTE — Discharge Instructions (Addendum)
 You were seen in the ED today for chest pain and abdominal pain.  Your workup showed no emergency causes to your symptoms today, including no evidence of gallstone disease.  Given your moderate risk of heart attack, please follow-up outpatient in the next few days for reassessment.  This is critical.  Information was placed in the AVS, and you should receive a phone call tomorrow to schedule appointment.  You can also call them tomorrow with the above phone number.  Please return the ED if you have any recurrence of chest pain.

## 2023-06-28 NOTE — ED Provider Notes (Addendum)
 [place] [facility] AT [facility] Provider Note   CSN: [id number] Arrival date & time: 06/28/23  1744     History  Chief Complaint  Patient presents with   Chest Pain   HPI  Steven [name] is a 63 y.o. male CAD s/p stents in 2022, hyperlipidemia, hypertension, CKD presents due to chest pain.  Patient states that at around noon today while he was working, he began experiencing substernal chest pain with tingling in his left arm.  He also had episodes of vomiting and diarrhea as well as abdominal pain.  He currently has persistent chest pain that radiates to his right side.  Paresthesias to the left upper extremity have improved.  He was initially concerned that he was having a heart attack, but now is concerned he could have a gallstone.  Has not had stress testing in a few years.  Does not take any blood thinners.  Was given aspirin  on arrival to the ED.   Chest Pain      Home Medications Prior to Admission medications   Medication Sig Start Date End Date Taking? Authorizing Provider  aspirin  EC (BAYER ASPIRIN  EC LOW DOSE) 81 MG tablet Take 81 mg by mouth daily.   Yes [provider]  budesonide -formoterol  (SYMBICORT ) 160-4.5 MCG/ACT inhaler Inhale 2 puffs into the lungs in the morning and at bedtime. Patient taking differently: Inhale 2 puffs into the lungs 2 (two) times daily as needed (for shortness of breath). 02/24/22  Yes Quillian Brunt, MD  dasatinib  (SPRYCEL ) 50 MG tablet Take 1 tablet (50 mg total) by mouth daily. 02/12/23  Yes Aurther Blue, NP  Evolocumab  (REPATHA  SURECLICK) 140 MG/ML SOAJ INJECT 140MG  INTO THE SKIN EVERY 14 DAYS 05/25/23  Yes O'Neal, Cathay Clonts, MD  hydrochlorothiazide  (HYDRODIURIL ) 25 MG tablet Take 25 mg by mouth daily.   Yes [provider]  meloxicam (MOBIC) 15 MG tablet Take 15 mg by mouth daily.   Yes [provider]  metoprolol  tartrate (LOPRESSOR ) 50 MG tablet Take 1 tablet (50 mg  total) by mouth 2 (two) times daily. 03/08/22  Yes O'Neal, Cathay Clonts, MD  nitroGLYCERIN  (NITROSTAT ) 0.4 MG SL tablet Place 1 tablet (0.4 mg total) under the tongue every 5 (five) minutes as needed for chest pain. 05/07/23  Yes Carie Charity, NP      Allergies    Atorvastatin , Rosuvastatin , and Statins    Review of Systems   Review of Systems  Cardiovascular:  Positive for chest pain.    Physical Exam Updated Vital Signs BP 135/80   Pulse 98   Temp 99.3 F (37.4 C) (Oral)   Resp 11   Ht 5\' 11"  (1.803 m)   Wt 103.9 kg   SpO2 95%   BMI 31.95 kg/m  Physical Exam Vitals and nursing note reviewed.  Constitutional:      General: He is not in acute distress.    Appearance: He is well-developed.  HENT:     Head: Normocephalic and atraumatic.     Right Ear: External ear normal.     Left Ear: External ear normal.     Mouth/Throat:     Mouth: Mucous membranes are moist.     Pharynx: No oropharyngeal exudate or posterior oropharyngeal erythema.  Eyes:     Conjunctiva/sclera: Conjunctivae normal.  Cardiovascular:     Rate and Rhythm: Normal rate and regular rhythm.     Pulses:          Radial  pulses are 2+ on the right side and 2+ on the left side.       Dorsalis pedis pulses are 2+ on the right side and 2+ on the left side.     Heart sounds: No murmur heard. Pulmonary:     Effort: Pulmonary effort is normal. No respiratory distress.     Breath sounds: Normal breath sounds.  Abdominal:     General: Abdomen is flat.     Palpations: Abdomen is soft.     Comments: Diffuse tenderness to palpation with guarding, particularly in the right upper quadrant.  Positive Murphy sign.  Musculoskeletal:        General: No swelling.     Cervical back: Neck supple.     Right lower leg: No edema.     Left lower leg: No edema.  Skin:    General: Skin is warm and dry.     Capillary Refill: Capillary refill takes less than 2 seconds.  Neurological:     Mental Status: He is alert.   Psychiatric:        Mood and Affect: Mood normal.     ED Results / Procedures / Treatments   Labs (all labs ordered are listed, but only abnormal results are displayed) Labs Reviewed  COMPREHENSIVE METABOLIC PANEL WITH GFR - Abnormal; Notable for the following components:      Result Value   CO2 18 (*)    Glucose, Bld 131 (*)    Creatinine, Ser 1.56 (*)    Total Protein 8.2 (*)    AST 47 (*)    ALT 47 (*)    GFR, Estimated 49 (*)    Anion gap 17 (*)    All other components within normal limits  CBC WITH DIFFERENTIAL/PLATELET - Abnormal; Notable for the following components:   WBC 16.9 (*)    Hemoglobin 17.4 (*)    Neutro Abs 15.6 (*)    Lymphs Abs 0.5 (*)    All other components within normal limits  I-STAT CHEM 8, ED - Abnormal; Notable for the following components:   Creatinine, Ser 1.60 (*)    Glucose, Bld 138 (*)    Calcium , Ion 1.03 (*)    TCO2 21 (*)    All other components within normal limits  RESP PANEL BY RT-PCR (RSV, FLU A&B, COVID)  RVPGX2  LIPASE, BLOOD  TROPONIN I (HIGH SENSITIVITY)  TROPONIN I (HIGH SENSITIVITY)    EKG EKG Interpretation Date/Time:  Thursday June 28 2023 20:53:25 EDT Ventricular Rate:  105 PR Interval:  138 QRS Duration:  109 QT Interval:  323 QTC Calculation: 427 R Axis:   -27  Text Interpretation: sinus tachycardia Borderline left axis deviation Low voltage, precordial leads Abnormal R-wave progression, early transition Borderline T abnormalities, anterior leads Confirmed by Hershel Los [phone]) on 06/28/2023 9:01:07 PM  Radiology US  Abdomen Limited RUQ (LIVER/GB) Result Date: 06/28/2023 CLINICAL DATA:  Right upper quadrant pain EXAM: ULTRASOUND ABDOMEN LIMITED RIGHT UPPER QUADRANT COMPARISON:  Right upper quadrant ultrasound 12/27/2021. FINDINGS: Gallbladder: No gallstones or wall thickening visualized. No sonographic Murphy sign noted by sonographer. Common bile duct: Diameter: 4.1 mm Liver: No focal lesion identified.  Increase in parenchymal echogenicity. Portal vein is patent on color Doppler imaging with normal direction of blood flow towards the liver. Other: None. IMPRESSION: 1. No cholelithiasis or sonographic evidence for acute cholecystitis. 2. Increased hepatic parenchymal echogenicity suggestive of steatosis. Electronically Signed   By: Tyron Gallon M.D.   On: 06/28/2023 23:58  DG Chest 2 View Result Date: 06/28/2023 CLINICAL DATA:  cp EXAM: CHEST - 2 VIEW COMPARISON:  September 04, 2021 FINDINGS: Lower lung volumes. No focal airspace consolidation, pleural effusion, or pneumothorax. No cardiomegaly. No acute fracture or destructive lesion. Multilevel thoracic osteophytosis. IMPRESSION: No acute cardiopulmonary abnormality. Electronically Signed   By: Rance Burrows M.D.   On: 06/28/2023 20:05    Procedures Procedures    Medications Ordered in ED Medications  ondansetron  (ZOFRAN -ODT) disintegrating tablet 4 mg (4 mg Oral Not Given 06/28/23 1811)  HYDROcodone -acetaminophen  (NORCO/VICODIN) 5-325 MG per tablet 1 tablet (1 tablet Oral Given 06/28/23 1814)  aspirin  chewable tablet 324 mg (324 mg Oral Given 06/28/23 1810)  ondansetron  (ZOFRAN ) injection 4 mg (4 mg Intravenous Given 06/28/23 1814)  lactated ringers  bolus 1,000 mL (1,000 mLs Intravenous New Bag/Given 06/28/23 2135)  morphine  (PF) 4 MG/ML injection 4 mg (4 mg Intravenous Given 06/28/23 2134)    ED Course/ Medical Decision Making/ A&P             HEART Score: 5                    Medical Decision Making Amount and/or Complexity of Data Reviewed Radiology: ordered.  Risk Prescription drug management.   Patient is alert, afebrile, and hemodynamically stable in mild distress from pain.  Physical exam as noted above, with diffuse pain over the abdomen most prominent in the right upper quadrant.  Differential includes ACS, pneumonia, pneumothorax, biliary pathology, PE, gastroenteritis, amongst other diagnoses.  Workup obtained through triage  demonstrates CMP with BUN 20, creatinine 1.56 (baseline is around 1.2-1.3), bicarb 18, mildly elevated transaminases with AST 47 and ALT 47, bili 1.1.  CBC with leukocytosis of 16.9, normal platelet count.  Lipase WNL.  First troponin 6.  Pending second troponin.  I personally interpreted patient's EKG, which demonstrated sinus rhythm with rate of 105, normal PR interval and QRS duration, normal QTc.  I do appreciate mild ST depressions in V3 and V4 with no obvious reciprocal changes.  I personally interpreted patient's chest x-ray, which demonstrated no focal consolidations concerning for pneumonia.  After my evaluation, I ordered patient fluids and morphine .  We had a bedside discussion about risks and benefits of contrasted CT scan given his history of CKD.  Upon shared decision making, we will begin with right upper quadrant ultrasound and consider CT abdomen pelvis if this is negative.    Second troponin returned at 6, stable from prior.  Right upper quadrant ultrasound negative for any acute gallbladder pathology.  On reassessment, patient has improved abdominal pain, states he has residual soreness but no pain, and no chest pain.  On reexamination of the abdomen, he has no guarding and no pain to the right upper quadrant on deep palpation.  Given reassuring troponins and resolution of pain with moderate risk hear score, he can have close outpatient follow-up in the next few days, with referral placed.  Spoke with patient about reassuring workup today but necessary urgent consultation by cardiology outpatient.  He is agreeable to this plan.  Also discussed reassuring ultrasound imaging.  Strict return precautions were given, and patient was discharged in stable condition.      Final Clinical Impression(s) / ED Diagnoses Final diagnoses:  Chest pain, unspecified type    Rx / DC Orders ED Discharge Orders          Ordered    Ambulatory referral to Cardiology       Comments: If you  have not  heard from the Cardiology office within the next 72 hours please call [phone].   06/28/23 2358              Lorain Robson, MD 06/28/23 2352    Lorain Robson, MD 06/29/23 0005    Hershel Los, MD 07/09/23 [phone]

## 2023-07-18 ENCOUNTER — Ambulatory Visit: Payer: BC Managed Care – PPO | Admitting: General Practice

## 2023-08-03 ENCOUNTER — Telehealth: Payer: Self-pay | Admitting: Cardiovascular Disease

## 2023-08-03 MED ORDER — NITROGLYCERIN 0.4 MG SL SUBL: 0.4000 mg | SUBLINGUAL_TABLET | SUBLINGUAL | 3 refills | Status: AC | PRN

## 2023-08-03 NOTE — Telephone Encounter (Signed)
 Rx sent to requested Pharmacy.

## 2023-08-03 NOTE — Telephone Encounter (Signed)
*  STAT* If patient is at the pharmacy, call can be transferred to refill team.   1. Which medications need to be refilled? (please list name of each medication and dose if known) nitroGLYCERIN  (NITROSTAT ) 0.4 MG SL tablet    2. Would you like to learn more about the convenience, safety, & potential cost savings by using the [facility]?    3. Are you open to using the [facility] (Type [facility]. ).   4. Which pharmacy/location (including street and city if local pharmacy) is medication to be sent to? [facility] - [place], [place] - [address]    5. Do they need a 30 day or 90 day supply? 30 day

## 2023-08-14 ENCOUNTER — Telehealth: Payer: Self-pay | Admitting: Cardiovascular Disease

## 2023-08-14 NOTE — Telephone Encounter (Signed)
 Patient noted he has started on Medicare effective June 1 and wants advice on next steps.

## 2023-08-20 NOTE — Progress Notes (Unsigned)
 Cardiology Clinic Note   Patient Name: Steven [name] Date of Encounter: 08/20/2023  Primary Care Provider:  Elester Grim, MD Primary Cardiologist:  Oneil Bigness, MD  Patient Profile    Steven [name] 63 year old male presents the clinic today for follow-up evaluation of his coronary artery disease and essential hypertension.  Past Medical History    Past Medical History:  Diagnosis Date   Chest pain    a. Cath 2007:  luminal irregs, normal LVF (Dr. Sharyn Deforest) - was placed on plavix  afterwards.;   b.  ETT-MV 11/13:   mild inferior ischemia, EF 59%.   CKD (chronic kidney disease), stage II    CML (chronic myeloid leukemia) (HCC)    a. Dx 2011 - followed @ [facility] - remission   Coronary artery disease    Hepatitis    IN THE 1980'S   Hyperlipidemia    a. Dx 2006   Hypertension    a. Dx 2006   Pneumonia    hx of PNA   Past Surgical History:  Procedure Laterality Date   BIOPSY  12/28/2021   Procedure: BIOPSY;  Surgeon: Felecia Hopper, MD;  Location: [facility] ENDOSCOPY;  Service: Gastroenterology;;   CARDIAC CATHETERIZATION  02/16/2012   30% tubular prox RCA stenosis, normal coronaries otherwise; LVEF 65%, no WMAs   CORONARY/GRAFT ACUTE MI REVASCULARIZATION N/A 02/03/2021   Procedure: Coronary/Graft Acute MI Revascularization;  Surgeon: Odie Benne, MD;  Location: [facility] INVASIVE CV LAB;  Service: Cardiovascular;  Laterality: N/A;   ESOPHAGOGASTRODUODENOSCOPY N/A 12/28/2021   Procedure: ESOPHAGOGASTRODUODENOSCOPY (EGD);  Surgeon: Felecia Hopper, MD;  Location: [facility];  Service: Gastroenterology;  Laterality: N/A;   LEFT HEART CATH AND CORONARY ANGIOGRAPHY N/A 02/03/2021   Procedure: LEFT HEART CATH AND CORONARY ANGIOGRAPHY;  Surgeon: Odie Benne, MD;  Location: [facility] INVASIVE CV LAB;  Service: Cardiovascular;  Laterality: N/A;   LEFT HEART CATHETERIZATION WITH CORONARY ANGIOGRAM N/A 02/16/2012   Procedure: LEFT HEART  CATHETERIZATION WITH CORONARY ANGIOGRAM;  Surgeon: Loyde Rule, MD;  Location: [facility] CATH LAB;  Service: Cardiovascular;  Laterality: N/A;   Left Rotator Cuff Repair     a. approx. 2003    Allergies  Allergies  Allergen Reactions   Atorvastatin  Other (See Comments)    myalgias   Rosuvastatin  Other (See Comments)    myalgias   Statins     Myalgia     History of Present Illness    Steven [name] has a PMH of coronary artery disease, hypertension, hyperlipidemia.  He is post PCI to his mid circumflex and mid LAD.  NSTEMI/CAD 02/03/2021, PMH also includes CML.  He was seen in follow-up by Dr. Rolm Clos on 02/05/2023.  He was reporting new chest discomfort on the left side of his chest which radiated down his arm.  He denied previous or similar episodes.  He also reported shortness of breath with activity such as walking around his property.  [place].  He noticed that his blood pressure felt high.  He noted a sensation in his face but was not checking his blood pressure at home regularly.  His LDL cholesterol was noted to be 61.  He was in remission from [facility] and is on lifelong medication for CML.  He was scheduled for colonoscopy and endoscopy 03/01/2023.  Coronary artery disease-symptoms of angina both at rest and with physical activity.  Underwent echocardiogram on 02/04/2021 which showed normal LVEF and normal diastolic parameters.  When he was  seen by Dr. Rolm Clos in November nuclear stress testing was ordered but has not been completed.  His EKG was normal at that time.  He was continued on aspirin , Repatha , and metoprolol .  His blood pressure was slightly elevated at 146/80.  He was continued on metoprolol  and hydrochlorothiazide .  He was instructed to keep a blood pressure log.  Blood pressure check was planned for 3 months.  Follow-up was planned for 1 year.  He presents to the clinic today for follow-up evaluation and states he has been noticing some shortness of breath  intermittently.  He continues to be very physically active working 40 hours/week.  He did note that he had an episode of shortness of breath and lightheadedness this morning.  He reports increased urination with increased HCTZ that his PCP has placed him on.  His blood pressure this morning is 122/74.  We reviewed stress testing recommendations.  He notes that he would not be able to pay for stress testing until he is placed on Medicare.  He plans to undergo stress testing in June.  We reviewed his previous cardiac catheterization and his medications.  He expressed understanding.  His LDL cholesterol is 61 on last check.  I will prescribe Imdur  15 mg daily, have him increase his p.o. hydration, and plan follow-up in 2 to 3 months.  Today he denies chest pain, lower extremity edema, fatigue, palpitations, melena, hematuria, hemoptysis, diaphoresis, weakness, presyncope, syncope, orthopnea, and PND.   Home Medications    Prior to Admission medications   Medication Sig Start Date End Date Taking? Authorizing Provider  acetaminophen  (TYLENOL ) 500 MG tablet Take 500 mg by mouth as needed for headache.    [provider]  aspirin  EC (BAYER ASPIRIN  EC LOW DOSE) 81 MG tablet Take 81 mg by mouth daily.    [provider]  budesonide -formoterol  (SYMBICORT ) 160-4.5 MCG/ACT inhaler Inhale 2 puffs into the lungs in the morning and at bedtime. 02/24/22   Quillian Brunt, MD  colchicine  0.6 MG tablet Take 0.6 mg by mouth as needed (gout).    [provider]  dasatinib  (SPRYCEL ) 50 MG tablet Take 1 tablet (50 mg total) by mouth daily. 02/12/23   Aurther Blue, NP  Evolocumab  (REPATHA  SURECLICK) 140 MG/ML SOAJ Inject 140 mg into the skin every 14 (fourteen) days. 09/22/22   Harrold Lincoln, MD  ferrous sulfate  325 (65 FE) MG EC tablet Take 1 tablet (325 mg total) by mouth 2 (two) times daily. Take on an empty stomach with a cup of orange juice.  If you are unable to tolerate this  you may eat afterwards. Patient not taking: Reported on 02/20/2022 12/29/21 02/27/22  True Fuss, MD  hydrochlorothiazide  (HYDRODIURIL ) 12.5 MG tablet Take 12.5 mg by mouth daily.    [provider]  metoprolol  tartrate (LOPRESSOR ) 50 MG tablet Take 1 tablet (50 mg total) by mouth 2 (two) times daily. 03/08/22   O'Neal, Cathay Clonts, MD  nitroGLYCERIN  (NITROSTAT ) 0.4 MG SL tablet Place 1 tablet (0.4 mg total) under the tongue every 5 (five) minutes as needed for chest pain. 02/05/21   Florette Hurry, NP  pantoprazole  (PROTONIX ) 40 MG tablet Take 1 tablet (40 mg total) by mouth 2 (two) times daily before a meal for 30 days, THEN 1 tablet (40 mg total) daily. Use this prescription first. Patient not taking: Reported on 02/20/2022 12/29/21 03/29/22  True Fuss, MD    Family History    Family History  Problem  Relation Age of Onset   Heart attack Father        Alive @ 73 (stenting @ 71)   Other Mother        Alive & well @ 35   Other Brother        Alive @ 77   He indicated that the status of his mother is unknown. He indicated that the status of his father is unknown. He indicated that the status of his brother is unknown.  Social History    Social History   Socioeconomic History   Marital status: Divorced    Spouse name: Not on file   Number of children: Not on file   Years of education: Not on file   Highest education level: Not on file  Occupational History   Not on file  Tobacco Use   Smoking status: Former    Current packs/day: 0.00    Average packs/day: 1 pack/day for 30.0 years (30.0 ttl pk-yrs)    Types: Cigarettes    Start date: 02/15/1975    Quit date: 02/14/2005    Years since quitting: 18.5   Smokeless tobacco: Never   Tobacco comments:    smoked 1-1/12 ppd for better part of 30 years  Substance and Sexual Activity   Alcohol use: Yes    Comment: Rare drink   Drug use: No   Sexual activity: Not on file  Other Topics Concern    Not on file  Social History Narrative   Lives in [place] by himself.  Works as an Personnel officer.   Social Drivers of Corporate investment banker Strain: Not on file  Food Insecurity: No Food Insecurity (12/27/2021)   Hunger Vital Sign    Worried About Running Out of Food in the Last Year: Never true    Ran Out of Food in the Last Year: Never true  Transportation Needs: No Transportation Needs (12/27/2021)   PRAPARE - Administrator, Civil Service (Medical): No    Lack of Transportation (Non-Medical): No  Physical Activity: Not on file  Stress: Not on file  Social Connections: Not on file  Intimate Partner Violence: Not At Risk (12/27/2021)   Humiliation, Afraid, Rape, and Kick questionnaire    Fear of Current or Ex-Partner: No    Emotionally Abused: No    Physically Abused: No    Sexually Abused: No     Review of Systems    General:  No chills, fever, night sweats or weight changes.  Cardiovascular:  No chest pain, dyspnea on exertion, edema, orthopnea, palpitations, paroxysmal nocturnal dyspnea. Dermatological: No rash, lesions/masses Respiratory: No cough, dyspnea Urologic: No hematuria, dysuria Abdominal:   No nausea, vomiting, diarrhea, bright red blood per rectum, melena, or hematemesis Neurologic:  No visual changes, wkns, changes in mental status. All other systems reviewed and are otherwise negative except as noted above.  Physical Exam    VS:  There were no vitals taken for this visit. , BMI There is no height or weight on file to calculate BMI. GEN: Well nourished, well developed, in no acute distress. HEENT: normal. Neck: Supple, no JVD, carotid bruits, or masses. Cardiac: RRR, no murmurs, rubs, or gallops. No clubbing, cyanosis, edema.  Radials/DP/PT 2+ and equal bilaterally.  Respiratory:  Respirations regular and unlabored, clear to auscultation bilaterally. GI: Soft, nontender, nondistended, BS + x 4. MS: no deformity or atrophy. Skin: warm  and dry, no rash. Neuro:  Strength and sensation are intact. Psych: Normal affect.  Accessory Clinical Findings    Recent Labs: 06/28/2023: ALT 47; BUN 23; Creatinine, Ser 1.60; Hemoglobin 16.7; Platelets 279; Potassium 4.3; Sodium 137   Recent Lipid Panel    Component Value Date/Time   CHOL 125 07/21/2021 0811   TRIG 58 07/21/2021 0811   HDL 46 07/21/2021 0811   CHOLHDL 2.7 07/21/2021 0811   CHOLHDL 4.1 02/04/2021 0127   VLDL 28 02/04/2021 0127   LDLCALC 66 07/21/2021 0811    No BP recorded.  {Refresh Note OR Click here to enter BP  :1}***    ECG personally reviewed by me today-none today.     Echocardiogram 02/04/2021 IMPRESSIONS     1. Left ventricular ejection fraction, by estimation, is 55 to 60%. The  left ventricle has normal function. The left ventricle has no regional  wall motion abnormalities. Left ventricular diastolic parameters were  normal.   2. Right ventricular systolic function is normal. The right ventricular  size is normal. There is normal pulmonary artery systolic pressure.   3. Left atrial size was moderately dilated.   4. The mitral valve is abnormal. Mild mitral valve regurgitation. No  evidence of mitral stenosis.   5. The aortic valve is tricuspid. There is mild calcification of the  aortic valve. Aortic valve regurgitation is not visualized. Aortic valve  sclerosis is present, with no evidence of aortic valve stenosis.   6. The inferior vena cava is normal in size with greater than 50%  respiratory variability, suggesting right atrial pressure of 3 mmHg.   FINDINGS   Left Ventricle: Left ventricular ejection fraction, by estimation, is 55  to 60%. The left ventricle has normal function. The left ventricle has no  regional wall motion abnormalities. The left ventricular internal cavity  size was normal in size. There is   no left ventricular hypertrophy. Left ventricular diastolic parameters  were normal.   Right Ventricle: The right  ventricular size is normal. No increase in  right ventricular wall thickness. Right ventricular systolic function is  normal. There is normal pulmonary artery systolic pressure. The tricuspid  regurgitant velocity is 2.25 m/s, and   with an assumed right atrial pressure of 3 mmHg, the estimated right  ventricular systolic pressure is 23.2 mmHg.   Left Atrium: Left atrial size was moderately dilated.   Right Atrium: Right atrial size was normal in size.   Pericardium: There is no evidence of pericardial effusion.   Mitral Valve: The mitral valve is abnormal. There is mild thickening of  the mitral valve leaflet(s). Mild mitral valve regurgitation. No evidence  of mitral valve stenosis.   Tricuspid Valve: The tricuspid valve is normal in structure. Tricuspid  valve regurgitation is not demonstrated. No evidence of tricuspid  stenosis.   Aortic Valve: The aortic valve is tricuspid. There is mild calcification  of the aortic valve. Aortic valve regurgitation is not visualized. Aortic  valve sclerosis is present, with no evidence of aortic valve stenosis.  Aortic valve mean gradient measures  5.0 mmHg. Aortic valve peak gradient measures 9.7 mmHg. Aortic valve area,  by VTI measures 3.04 cm.   Pulmonic Valve: The pulmonic valve was normal in structure. Pulmonic valve  regurgitation is not visualized. No evidence of pulmonic stenosis.   Aorta: The aortic root is normal in size and structure.   Venous: The inferior vena cava is normal in size with greater than 50%  respiratory variability, suggesting right atrial pressure of 3 mmHg.   IAS/Shunts: No atrial level shunt detected by  color flow Doppler.       Assessment & Plan   1.  Coronary artery disease-underwent LHC with PCI to his mid circumflex and mid LAD in 2022.  During last visit 11/24 nuclear stress testing was recommended but not completed.  He denies further episodes of angina both at rest and with exertion. Continue  aspirin , Repatha , metoprolol  Heart healthy low-sodium diet Increase physical activity as tolerated Start Imdur  15 mg daily We will plan for stress testing around June or sooner if symptoms worsen.  Hyperlipidemia-LDL 61 on 11/01/2022.  Reports compliance with Repatha . High-fiber diet Continue Repatha  Increase physical activity as tolerated  Essential hypertension-BP today 122/74. Low-sodium diet-salty 6 diet sheet given Maintain blood pressure log Continue metoprolol , hydrochlorothiazide   Disposition: Follow-up with Dr. Rolm Clos or me in 2-3 months.   Chet Cota. Taila Basinski NP-C     08/20/2023, 1:00 PM [facility] [address] Office [phone] Fax [phone]    I spent 14*** minutes examining this patient, reviewing medications, and using patient centered shared decision making involving their cardiac care.   I spent  20 minutes reviewing past medical history,  medications, and prior cardiac tests.

## 2023-08-23 ENCOUNTER — Encounter: Payer: Self-pay | Admitting: General Practice

## 2023-08-23 ENCOUNTER — Ambulatory Visit: Attending: General Practice | Admitting: General Practice

## 2023-08-23 VITALS — BP 138/80 | HR 75 | Ht 71.0 in | Wt 221.6 lb

## 2023-08-23 DIAGNOSIS — I2510 Atherosclerotic heart disease of native coronary artery without angina pectoris: Secondary | ICD-10-CM | POA: Insufficient documentation

## 2023-08-23 DIAGNOSIS — E782 Mixed hyperlipidemia: Secondary | ICD-10-CM | POA: Diagnosis not present

## 2023-08-23 DIAGNOSIS — I10 Essential (primary) hypertension: Secondary | ICD-10-CM | POA: Diagnosis not present

## 2023-08-23 DIAGNOSIS — R0602 Shortness of breath: Secondary | ICD-10-CM | POA: Diagnosis present

## 2023-08-23 DIAGNOSIS — R0789 Other chest pain: Secondary | ICD-10-CM | POA: Insufficient documentation

## 2023-08-23 MED ORDER — AMLODIPINE BESYLATE 2.5 MG PO TABS: 2.5000 mg | ORAL_TABLET | Freq: Every day | ORAL | 0 refills | Status: DC

## 2023-08-23 NOTE — Patient Instructions (Signed)
 Medication Instructions:  BEGIN AMLODIPINE 2.5MG . TAKE THIS MEDICATION DAILY. *If you need a refill on your cardiac medications before your next appointment, please call your pharmacy*   Lab Work: IN ONE WEEK RETURN FOR LABS........... BMP, CBC If you have labs (blood work) drawn today and your tests are completely normal, you will receive your results only by: MyChart Message (if you have MyChart) OR A paper copy in the mail If you have any lab test that is abnormal or we need to change your treatment, we will call you to review the results.   Testing/Procedures: Aleta Anda has ordered a Lexiscan Myocardial Perfusion Imaging Study.  Please arrive 15 minutes prior to your appointment time for registration and insurance purposes.   The test will take approximately 3 to 4 hours to complete; you may bring reading material.  If someone comes with you to your appointment, they will need to remain in the main lobby due to limited space in the testing area. **If you are pregnant or breastfeeding, please notify the nuclear lab prior to your appointment**   How to prepare for your Myocardial Perfusion Test: Do not eat or drink 3 hours prior to your test, except you may have water. Do not consume products containing caffeine (regular or decaffeinated) 12 hours prior to your test. (ex: coffee, chocolate, sodas, tea). Do wear comfortable clothes (no dresses or overalls) and walking shoes, tennis shoes preferred (No heels or open toe shoes are allowed). Do NOT wear cologne, perfume, aftershave, or lotions (deodorant is allowed). If you use an inhaler, use it the AM of your test and bring it with you.  If you use a nebulizer, use it the AM of your test.  If these instructions are not followed, your test will have to be rescheduled.    Follow-Up: At [facility], you and your health needs are our priority.  As part of our continuing mission to provide you with exceptional heart care, we have  created designated Provider Care Teams.  These Care Teams include your primary Cardiologist (physician) and Advanced Practice Providers (APPs -  Physician Assistants and Nurse Practitioners) who all work together to provide you with the care you need, when you need it.  We recommend signing up for the patient portal called MyChart.  Sign up information is provided on this After Visit Summary.  MyChart is used to connect with patients for Virtual Visits (Telemedicine).  Patients are able to view lab/test results, encounter notes, upcoming appointments, etc.  Non-urgent messages can be sent to your provider as well.   To learn more about what you can do with MyChart, go to [email or web address].    Your next appointment:   3-4 month(s)  Provider:   Lawana Pray, NP          Other Instructions Thank you for choosing [facility]!

## 2023-08-24 ENCOUNTER — Telehealth (HOSPITAL_COMMUNITY): Payer: Self-pay

## 2023-08-24 ENCOUNTER — Telehealth (HOSPITAL_COMMUNITY): Payer: Self-pay | Admitting: Radiology

## 2023-08-24 NOTE — Telephone Encounter (Signed)
 Detailed instructions left on the patient's answering machine. Steven [name] CCT

## 2023-08-24 NOTE — Telephone Encounter (Signed)
 Patient given detailed instructions per Myocardial Perfusion Study Information Sheet for the test on 09/04/23 at 7:45. Patient notified to arrive 15 minutes early and that it is imperative to arrive on time for appointment to keep from having the test rescheduled.  If you need to cancel or reschedule your appointment, please call the office within 24 hours of your appointment. . Patient verbalized understanding.EHK

## 2023-08-29 ENCOUNTER — Ambulatory Visit: Payer: Self-pay | Admitting: General Practice

## 2023-08-29 LAB — CBC
Hematocrit: 46.5 % (ref 37.5–51.0)
Hemoglobin: 15.9 g/dL (ref 13.0–17.7)
MCH: 30.6 pg (ref 26.6–33.0)
MCHC: 34.2 g/dL (ref 31.5–35.7)
MCV: 89 fL (ref 79–97)
Platelets: 237 10*3/uL (ref 150–450)
RBC: 5.2 x10E6/uL (ref 4.14–5.80)
RDW: 13.3 % (ref 11.6–15.4)
WBC: 5.7 10*3/uL (ref 3.4–10.8)

## 2023-08-29 LAB — BASIC METABOLIC PANEL WITH GFR
BUN/Creatinine Ratio: 11 (ref 10–24)
BUN: 18 mg/dL (ref 8–27)
CO2: 20 mmol/L (ref 20–29)
Calcium: 9.5 mg/dL (ref 8.6–10.2)
Chloride: 102 mmol/L (ref 96–106)
Creatinine, Ser: 1.58 mg/dL — ABNORMAL HIGH (ref 0.76–1.27)
Glucose: 88 mg/dL (ref 70–99)
Potassium: 4.1 mmol/L (ref 3.5–5.2)
Sodium: 141 mmol/L (ref 134–144)
eGFR: 49 mL/min/{1.73_m2} — ABNORMAL LOW (ref >59)

## 2023-08-30 ENCOUNTER — Other Ambulatory Visit: Payer: Self-pay | Admitting: General Practice

## 2023-08-30 DIAGNOSIS — E782 Mixed hyperlipidemia: Secondary | ICD-10-CM

## 2023-08-30 DIAGNOSIS — R0789 Other chest pain: Secondary | ICD-10-CM

## 2023-08-30 DIAGNOSIS — R0602 Shortness of breath: Secondary | ICD-10-CM

## 2023-08-30 DIAGNOSIS — I2510 Atherosclerotic heart disease of native coronary artery without angina pectoris: Secondary | ICD-10-CM

## 2023-08-30 DIAGNOSIS — I10 Essential (primary) hypertension: Secondary | ICD-10-CM

## 2023-09-03 ENCOUNTER — Telehealth: Payer: Self-pay

## 2023-09-03 NOTE — Telephone Encounter (Signed)
 Pt has called needs his scripts to be sent to [facility]; states that his drug coverage has changed.

## 2023-09-04 ENCOUNTER — Other Ambulatory Visit: Payer: Self-pay | Admitting: Oncology

## 2023-09-04 ENCOUNTER — Ambulatory Visit (HOSPITAL_COMMUNITY)
Admission: RE | Admit: 2023-09-04 | Discharge: 2023-09-04 | Disposition: A | Discharge status: Home / Self Care | Source: Ambulatory Visit | Attending: Internal Medicine | Admitting: Internal Medicine

## 2023-09-04 DIAGNOSIS — R0789 Other chest pain: Secondary | ICD-10-CM | POA: Insufficient documentation

## 2023-09-04 DIAGNOSIS — I10 Essential (primary) hypertension: Secondary | ICD-10-CM | POA: Diagnosis present

## 2023-09-04 DIAGNOSIS — E782 Mixed hyperlipidemia: Secondary | ICD-10-CM | POA: Diagnosis not present

## 2023-09-04 DIAGNOSIS — R0602 Shortness of breath: Secondary | ICD-10-CM | POA: Insufficient documentation

## 2023-09-04 DIAGNOSIS — C9210 Chronic myeloid leukemia, BCR/ABL-positive, not having achieved remission: Secondary | ICD-10-CM

## 2023-09-04 DIAGNOSIS — I2510 Atherosclerotic heart disease of native coronary artery without angina pectoris: Secondary | ICD-10-CM | POA: Insufficient documentation

## 2023-09-04 LAB — MYOCARDIAL PERFUSION IMAGING
LV dias vol: 113 mL (ref 62–150)
LV sys vol: 39 mL (ref 4.2–5.8)
Nuc Stress EF: 65 %
Peak HR: 92 {beats}/min
Rest HR: 68 {beats}/min
Rest Nuclear Isotope Dose: 11 mCi
SDS: 0
SRS: 2
SSS: 0
ST Depression (mm): 0 mm
Stress Nuclear Isotope Dose: 32.3 mCi
TID: 1

## 2023-09-04 MED ORDER — TECHNETIUM TC 99M TETROFOSMIN IV KIT
11.0000 | PACK | Freq: Once | INTRAVENOUS | Status: AC | PRN
  Administered 2023-09-04: 11 via INTRAVENOUS

## 2023-09-04 MED ORDER — REGADENOSON 0.4 MG/5ML IV SOLN
0.4000 mg | Freq: Once | INTRAVENOUS | Status: AC
  Administered 2023-09-04: 0.4 mg via INTRAVENOUS

## 2023-09-04 MED ORDER — TECHNETIUM TC 99M TETROFOSMIN IV KIT
32.3000 | PACK | Freq: Once | INTRAVENOUS | Status: AC | PRN
  Administered 2023-09-04: 32.3 via INTRAVENOUS

## 2023-09-04 MED ORDER — REGADENOSON 0.4 MG/5ML IV SOLN
INTRAVENOUS | Status: AC
  Filled 2023-09-04: qty 5

## 2023-09-04 MED ORDER — DASATINIB 50 MG PO TABS
50.0000 mg | ORAL_TABLET | Freq: Every day | ORAL | 11 refills | Status: DC
Start: 2023-09-04 — End: 2023-09-11

## 2023-09-06 ENCOUNTER — Other Ambulatory Visit (HOSPITAL_COMMUNITY): Payer: Self-pay

## 2023-09-06 ENCOUNTER — Telehealth: Payer: Self-pay

## 2023-09-06 ENCOUNTER — Telehealth: Payer: Self-pay | Admitting: Pharmacy Technician

## 2023-09-06 NOTE — Telephone Encounter (Signed)
 Pt called in, needs help with the Dasatinib  script. They are charging him approximately 1200/mo, which he can't afford. He states he was getting assistance thru company or group with Dasatinib - but once he received Medicare (08/12/2023)- they will no longer help him. He has another 30d supply of the Dasatinib , and that's it. I sent above message to Dr Ezzard, Kaitlyn Schomburg,RPH and Lizbeth Sprague, [place].

## 2023-09-06 NOTE — Telephone Encounter (Signed)
 Oral Oncology Patient Advocate Encounter  Per previous note/message from the nurse about the pt not being able to afford his medication I called [facility] to reverse their prescription they had filled for the pt so that we may test claim and check with other pharmacies about filling his medication. There are no grants open right now, but I will put him on the wait list. I also sent a message to [place] with [employer] to see if they had any funding to help with this medication, right now.  Shelba Solomons, CPhT Specialty Pharmacy Patient Advocate Phone: [phone] Fax: [phone]

## 2023-09-09 ENCOUNTER — Ambulatory Visit: Payer: Self-pay | Admitting: General Practice

## 2023-09-10 ENCOUNTER — Other Ambulatory Visit (HOSPITAL_COMMUNITY): Payer: Self-pay

## 2023-09-10 NOTE — Telephone Encounter (Signed)
 Oral Oncology Patient Advocate Encounter  [employer] offers internal funding for medicare patients with generic medications, including dasatinib . I have requested the rx be resent to their pharmacy so they can begin the assistance process.  Estefana Sox, CPhT-Adv Oncology Pharmacy Patient Advocate [facility]  Direct Number: [phone]  Fax: [phone]

## 2023-09-11 ENCOUNTER — Other Ambulatory Visit: Payer: Self-pay | Admitting: Hematology and Oncology

## 2023-09-11 DIAGNOSIS — C9210 Chronic myeloid leukemia, BCR/ABL-positive, not having achieved remission: Secondary | ICD-10-CM

## 2023-09-11 MED ORDER — DASATINIB 50 MG PO TABS: 50.0000 mg | ORAL_TABLET | Freq: Every day | ORAL | 11 refills | Status: AC

## 2023-09-13 ENCOUNTER — Other Ambulatory Visit (HOSPITAL_COMMUNITY): Payer: Self-pay

## 2023-09-13 NOTE — Telephone Encounter (Signed)
 Oral Oncology Patient Advocate Encounter  Reached out to patient to further discuss assistance options and clarify the difference between [employer] Access Support and the financial support that [employer] is offering.  Left vm.  Estefana Sox, CPhT-Adv Oncology Pharmacy Patient Advocate [facility]  Direct Number: [phone]  Fax: [phone]

## 2023-09-13 NOTE — Telephone Encounter (Signed)
 Oral Oncology Patient Advocate Encounter  Spoke with the patient and discussed the differences in [employer] Access Support for commercial and medicare patients. Also discussed their current enrollment guidelines.   I further advised patient on what [employer] was hoping to offer through their internal funding. Patient stated he has spoken with the [employer] team and they are supposed to be getting in touch with him today regarding his eligibility for this assistance.  Patient agreed to contact me if he is ineligible for their program, or if the program does not offer sufficient funding to cover his dasatinib  so that he can afford the cost.   I will continue to keep up with his case at [employer] via our liaison Cathy.   Estefana Sox, CPhT-Adv Oncology Pharmacy Patient Advocate [facility]  Direct Number: [phone]  Fax: [phone]

## 2023-09-18 NOTE — Telephone Encounter (Signed)
 Oral Oncology Patient Advocate Encounter  Checked in with [employer] and patient is currently pending approval for an [employer]. I notified the pharmacy that patient is already on therapy and they have agreed to expedite his case. I will continue to follow until resolution  Estefana Sox, CPhT-Adv Oncology Pharmacy Patient Advocate [facility]  Direct Number: [phone]  Fax: [phone]

## 2023-09-19 ENCOUNTER — Other Ambulatory Visit (HOSPITAL_COMMUNITY): Payer: Self-pay

## 2023-09-21 ENCOUNTER — Other Ambulatory Visit (HOSPITAL_COMMUNITY): Payer: Self-pay

## 2023-09-21 NOTE — Telephone Encounter (Signed)
 Oral Oncology Patient Advocate Encounter  Called [facility] and confirmed receipt of the diagnosis form. They are processing this. I requested they expedite since the patient is in need of his medication ASAP however, they stated they are unable to do so and standard processing times are 7-10 days.  I spoke with patient and advised him to call and check in early next week in the hopes that it will be processed sooner than expected.  Once application has received a determination [employer] will need ot be notified so they can finish processing the rx.  Estefana Sox, CPhT-Adv Oncology Pharmacy Patient Advocate [facility]  Direct Number: [phone]  Fax: [phone]

## 2023-09-26 ENCOUNTER — Other Ambulatory Visit: Payer: Self-pay | Admitting: Oncology

## 2023-09-26 DIAGNOSIS — C9210 Chronic myeloid leukemia, BCR/ABL-positive, not having achieved remission: Secondary | ICD-10-CM

## 2023-09-26 NOTE — Progress Notes (Unsigned)
 [facility]  [address] [place],  [place]  [address] [phone]  Clinic Day: 09/27/2023  Referring physician: Vernon Velna SAUNDERS, MD  HISTORY OF PRESENT ILLNESS:  The patient is a 63 y.o. 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  He has been on dasatinib  50 mg daily, from which he has had a complete hematologic, cytogenetic, and molecular response.  He comes in today for routine followup.  Since his last visit, the patient has been doing well.  He continues to tolerate his dasatinib  therapy very well.  He denies having any systemic symptoms which concern him for his CML being refractory to his dasatinib .    PHYSICAL EXAM:  Blood pressure (!) 140/83, pulse 74, temperature 97.7 F (36.5 C), temperature source Oral, resp. rate 16, height 5' 11 (1.803 m), weight 222 lb 12.8 oz (101.1 kg), SpO2 94%. Wt Readings from Last 3 Encounters:  09/27/23 222 lb 12.8 oz (101.1 kg)  09/04/23 221 lb (100.2 kg)  08/23/23 221 lb 9.6 oz (100.5 kg)   Body mass index is 31.07 kg/m. Performance status (ECOG): 0 - Asymptomatic Physical Exam Constitutional:      Appearance: Normal appearance. He is not ill-appearing.  HENT:     Mouth/Throat:     Mouth: Mucous membranes are moist.     Pharynx: Oropharynx is clear. No oropharyngeal exudate or posterior oropharyngeal erythema.  Cardiovascular:     Rate and Rhythm: Normal rate and regular rhythm.     Heart sounds: No murmur heard.    No friction rub. No gallop.  Pulmonary:     Effort: Pulmonary effort is normal. No respiratory distress.     Breath sounds: Normal breath sounds. No wheezing, rhonchi or rales.  Abdominal:     General: Bowel sounds are normal. There is no distension.     Palpations: Abdomen is soft. There is no mass.     Tenderness: There is no abdominal tenderness.  Musculoskeletal:        General: No swelling.     Right lower leg: No edema.     Left lower leg: No  edema.  Lymphadenopathy:     Cervical: No cervical adenopathy.     Upper Body:     Right upper body: No supraclavicular or axillary adenopathy.     Left upper body: No supraclavicular or axillary adenopathy.     Lower Body: No right inguinal adenopathy. No left inguinal adenopathy.  Skin:    General: Skin is warm.     Coloration: Skin is not jaundiced.     Findings: No lesion or rash.  Neurological:     General: No focal deficit present.     Mental Status: He is alert and oriented to person, place, and time. Mental status is at baseline.  Psychiatric:        Mood and Affect: Mood normal.        Behavior: Behavior normal.        Thought Content: Thought content normal.     LABS:      Latest Ref Rng & Units 09/27/2023    1:50 PM 08/28/2023   11:34 AM 06/28/2023    6:06 PM  CBC  WBC 4.0 - 10.5 K/uL 6.9  5.7    Hemoglobin 13.0 - 17.0 g/dL 85.4  84.0  83.2   Hematocrit 39.0 - 52.0 % 41.3  46.5  49.0   Platelets 150 - 400 K/uL 229  237  Latest Ref Rng & Units 08/28/2023   11:34 AM 06/28/2023    6:06 PM 06/28/2023    6:03 PM  CMP  Glucose 70 - 99 mg/dL 88  861  868   BUN 8 - 27 mg/dL 18  23  20    Creatinine 0.76 - 1.27 mg/dL 8.41  8.39  8.43   Sodium 134 - 144 mmol/L 141  137  138   Potassium 3.5 - 5.2 mmol/L 4.1  4.3  4.4   Chloride 96 - 106 mmol/L 102  106  103   CO2 20 - 29 mmol/L 20   18   Calcium  8.6 - 10.2 mg/dL 9.5   89.7   Total Protein 6.5 - 8.1 g/dL   8.2   Total Bilirubin 0.0 - 1.2 mg/dL   1.1   Alkaline Phos 38 - 126 U/L   52   AST 15 - 41 U/L   47   ALT 0 - 44 U/L   47    CML transcripts pending  ASSESSMENT & PLAN:  Assessment/Plan:  A 63 y.o. male with chronic myelogenous leukemia.  I am extremely pleased as his labs continue to show he is in a complete molecular response.  This reflects the continued efficacy of his dasatinib  at 50 mg.  He will continue to take this medication as long as he remains in a complete molecular response.  As he is doing very  well from a CML perspective, I will see him back in another 6 months for repeat clinical assessment.  The patient understands all the plans discussed today and is in agreement with them.    Mallary Kreger DELENA Kerns, MD

## 2023-09-27 ENCOUNTER — Inpatient Hospital Stay: Attending: Oncology | Admitting: Oncology

## 2023-09-27 ENCOUNTER — Other Ambulatory Visit: Payer: Self-pay | Admitting: Oncology

## 2023-09-27 ENCOUNTER — Inpatient Hospital Stay

## 2023-09-27 VITALS — BP 140/83 | HR 74 | Temp 97.7°F | Resp 16 | Ht 71.0 in | Wt 222.8 lb

## 2023-09-27 DIAGNOSIS — C9210 Chronic myeloid leukemia, BCR/ABL-positive, not having achieved remission: Secondary | ICD-10-CM

## 2023-09-27 LAB — CBC WITH DIFFERENTIAL (CANCER CENTER ONLY)
Abs Immature Granulocytes: 0.01 K/uL (ref 0.00–0.07)
Basophils Absolute: 0.1 K/uL (ref 0.0–0.1)
Basophils Relative: 1 %
Eosinophils Absolute: 0.5 K/uL (ref 0.0–0.5)
Eosinophils Relative: 7 %
HCT: 41.3 % (ref 39.0–52.0)
Hemoglobin: 14.5 g/dL (ref 13.0–17.0)
Immature Granulocytes: 0 %
Lymphocytes Relative: 28 %
Lymphs Abs: 2 K/uL (ref 0.7–4.0)
MCH: 30 pg (ref 26.0–34.0)
MCHC: 35.1 g/dL (ref 30.0–36.0)
MCV: 85.3 fL (ref 80.0–100.0)
Monocytes Absolute: 0.5 K/uL (ref 0.1–1.0)
Monocytes Relative: 7 %
Neutro Abs: 3.9 K/uL (ref 1.7–7.7)
Neutrophils Relative %: 57 %
Platelet Count: 229 K/uL (ref 150–400)
RBC: 4.84 MIL/uL (ref 4.22–5.81)
RDW: 14.5 % (ref 11.5–15.5)
WBC Count: 6.9 K/uL (ref 4.0–10.5)
nRBC: 0 % (ref 0.0–0.2)

## 2023-09-27 NOTE — Telephone Encounter (Signed)
 Oral Oncology Patient Advocate Encounter  Received a call this morning from [facility] @ [employer]. The pt called them yesterday. He also called [employer] and they confirmed they hadn't worked on his case at all. [employer] was able to get him over to their inhouse funding/finance reps and were able to get him funding to provide his med. They are in the process of getting the med to him. They are just waiting on him to call back with where they can ship it as he said he couldn't have it shipped to his house.  Donny will call back once they have shipped the med, with a delivery date.  Shelba Solomons, CPhT Specialty Pharmacy Patient Advocate Phone: [phone] Fax: [phone]

## 2023-10-02 LAB — BCR-ABL1, CML/ALL, PCR, QUANT
E1A2 Transcript: <0.0032 %
Interpretation (BCRAL):: NEGATIVE
b2a2 transcript: <0.0032 %
b3a2 transcript: <0.0032 %

## 2023-10-02 NOTE — Telephone Encounter (Addendum)
 Oral Oncology Patient Advocate Encounter  [employer] informed us  that they are shipping the pt's medication (Dasatinib ) today and he should receive it tomorrow 10/03/23. They were able to get him financial assistance to keep his copay $0.

## 2023-11-16 ENCOUNTER — Other Ambulatory Visit: Payer: Self-pay | Admitting: General Practice

## 2023-11-16 DIAGNOSIS — I10 Essential (primary) hypertension: Secondary | ICD-10-CM

## 2023-11-24 NOTE — Progress Notes (Unsigned)
 Cardiology Clinic Note   Patient Name: Kemonte Ullman Date of Encounter: 11/27/2023  Primary Care Provider:  Vernon Velna SAUNDERS, MD Primary Cardiologist:  Darryle ONEIDA Decent, MD  Patient Profile    Rasheen Bells 63 year old male presents the clinic today for follow-up evaluation of his coronary artery disease and hypertension.  Past Medical History    Past Medical History:  Diagnosis Date   Chest pain    a. Cath 2007:  luminal irregs, normal LVF (Dr. Claudene) - was placed on plavix  afterwards.;   b.  ETT-MV 11/13:   mild inferior ischemia, EF 59%.   CKD (chronic kidney disease), stage II    CML (chronic myeloid leukemia) (HCC)    a. Dx 2011 - followed @ [facility] - remission   Coronary artery disease    Hepatitis    IN THE 1980'S   Hyperlipidemia    a. Dx 2006   Hypertension    a. Dx 2006   Pneumonia    hx of PNA   Past Surgical History:  Procedure Laterality Date   BIOPSY  12/28/2021   Procedure: BIOPSY;  Surgeon: Elicia Claw, MD;  Location: [facility]COPY;  Service: Gastroenterology;;   CARDIAC CATHETERIZATION  02/16/2012   30% tubular prox RCA stenosis, normal coronaries otherwise; LVEF 65%, no WMAs   CORONARY/GRAFT ACUTE MI REVASCULARIZATION N/A 02/03/2021   Procedure: Coronary/Graft Acute MI Revascularization;  Surgeon: Verlin Lonni BIRCH, MD;  Location: [facility] INVASIVE CV LAB;  Service: Cardiovascular;  Laterality: N/A;   ESOPHAGOGASTRODUODENOSCOPY N/A 12/28/2021   Procedure: ESOPHAGOGASTRODUODENOSCOPY (EGD);  Surgeon: Elicia Claw, MD;  Location: [facility];  Service: Gastroenterology;  Laterality: N/A;   LEFT HEART CATH AND CORONARY ANGIOGRAPHY N/A 02/03/2021   Procedure: LEFT HEART CATH AND CORONARY ANGIOGRAPHY;  Surgeon: Verlin Lonni BIRCH, MD;  Location: [facility] INVASIVE CV LAB;  Service: Cardiovascular;  Laterality: N/A;   LEFT HEART CATHETERIZATION WITH CORONARY ANGIOGRAM N/A 02/16/2012   Procedure: LEFT HEART CATHETERIZATION  WITH CORONARY ANGIOGRAM;  Surgeon: Maude JAYSON Emmer, MD;  Location[facility] CATH LAB;  Service: Cardiovascular;  Laterality: N/A;   Left Rotator Cuff Repair     a. approx. 2003    Allergies  Allergies  Allergen Reactions   Atorvastatin  Other (See Comments)    myalgias   Rosuvastatin  Other (See Comments)    myalgias   Statins     Myalgia     History of Present Illness    Taurean Ju has a PMH of coronary artery disease, hypertension, hyperlipidemia.  He is post PCI to his mid circumflex and mid LAD.  NSTEMI/CAD 02/03/2021, PMH also includes CML.  He was seen in follow-up by Dr. Decent on 02/05/2023.  He was reporting new chest discomfort on the left side of his chest which radiated down his arm.  He denied previous or similar episodes.  He also reported shortness of breath with activity such as walking around his property.  [place].  He noticed that his blood pressure felt high.  He noted a sensation in his face but was not checking his blood pressure at home regularly.  His LDL cholesterol was noted to be 61.  He was in remission from [facility] and is on lifelong medication for CML.  He was scheduled for colonoscopy and endoscopy 03/01/2023.  Coronary artery disease-symptoms of angina both at rest and with physical activity.  Underwent echocardiogram on 02/04/2021 which showed normal LVEF and normal diastolic parameters.  When he was seen  by Dr. Barbaraann in November nuclear stress testing was ordered but has not been completed.  His EKG was normal at that time.  He was continued on aspirin , Repatha , and metoprolol .  His blood pressure was slightly elevated at 146/80.  He was continued on metoprolol  and hydrochlorothiazide .  He was instructed to keep a blood pressure log.  Blood pressure check was planned for 3 months.  Follow-up was planned for 1 year.  He presented to the clinic 05/07/23 for follow-up evaluation and stated he had been noticing some shortness of breath  intermittently.  He continued to be very physically active working 40 hours/week.  He did note that he had an episode of shortness of breath and lightheadedness that morning.  He reported increased urination with increased HCTZ that his PCP has placed him on.  His blood pressure was 122/74.  We reviewed stress testing recommendations.  He noted that he would not be able to pay for stress testing until he was placed on Medicare.  He planed to undergo stress testing in June.  We reviewed his previous cardiac catheterization and his medications.  He expressed understanding.  His LDL cholesterol was 61 on last check.  I  prescribed Imdur  15 mg daily, had him increase his p.o. hydration, and follow-up in 2 to 3 months was planned  He was seen and evaluated in the emergency department on 06/28/2023.  He reported that he developed substernal chest pain with tingling to his left arm.  He had episodes of vomiting and diarrhea as well as abdominal pain.  On exam in the emergency department he had persistent chest pain that radiated to his right arm.  The paresthesia to his left upper extremity had improved.  He had serial troponins which were negative.  He felt as if he may have a gallstone.  His ultrasound of his abdomen showed no cholelithiasis or evidence of acute cholecystitis.  He was noticed to have increased hepatic parenchymal echogenicity suggestive of steatosis.  He received IV fluids and morphine .  His symptoms improved.  He denied chest pain to deep palpation.  He was discharged in stable condition and cardiology follow-up was recommended.  He presented to the clinic 6/25 for evaluation and stated he was now on Medicare.  We reviewed his recent hospitalization.  He expressed understanding.  We reviewed his previous cardiac catheterization.  He reported that he had an upcoming appointment with GI.  They planned to do upper and lower endoscopy.  We reviewed recommendations for nuclear stress testing.  He  expressed understanding.  He was ready to proceed with this.  I order labs and  nuclear stress testing. I also started amlodipine  2.5 mg daily for better blood pressure control.   His stress testing 09/04/2023 showed low risk, no ischemia and an EF of 65%.  He presents to the clinic today for follow-up evaluation and states he continues to be very physically active walking at work and playing 18 holes of golf on the weekends.  He plays in [employer].  He reports that he had some sickness a few weeks ago and noted that his blood pressure was increased.  Today in the clinic his blood pressure is 142/76.  At home his blood pressures been in the 130s-140 systolic.  We reviewed his stress testing.  He expressed understanding.  I will increase his amlodipine  to 5 mg daily and plan follow-up in 6 months.  Today he denies chest pain, lower extremity edema, fatigue, palpitations, melena, hematuria, hemoptysis,  diaphoresis, weakness, presyncope, syncope, orthopnea, and PND.   Home Medications    Prior to Admission medications   Medication Sig Start Date End Date Taking? Authorizing Provider  acetaminophen  (TYLENOL ) 500 MG tablet Take 500 mg by mouth as needed for headache.    [provider]  aspirin  EC (BAYER ASPIRIN  EC LOW DOSE) 81 MG tablet Take 81 mg by mouth daily.    [provider]  budesonide -formoterol  (SYMBICORT ) 160-4.5 MCG/ACT inhaler Inhale 2 puffs into the lungs in the morning and at bedtime. 02/24/22   Kassie Acquanetta Bradley, MD  colchicine  0.6 MG tablet Take 0.6 mg by mouth as needed (gout).    [provider]  dasatinib  (SPRYCEL ) 50 MG tablet Take 1 tablet (50 mg total) by mouth daily. 02/12/23   Geofm Delon BRAVO, NP  Evolocumab  (REPATHA  SURECLICK) 140 MG/ML SOAJ Inject 140 mg into the skin every 14 (fourteen) days. 09/22/22   O'NealDarryle Ned, MD  ferrous sulfate  325 (65 FE) MG EC tablet Take 1 tablet (325 mg total) by mouth 2 (two) times daily. Take on an empty  stomach with a cup of orange juice.  If you are unable to tolerate this you may eat afterwards. Patient not taking: Reported on 02/20/2022 12/29/21 02/27/22  Kenard Zachary PARAS, MD  hydrochlorothiazide  (HYDRODIURIL ) 12.5 MG tablet Take 12.5 mg by mouth daily.    [provider]  metoprolol  tartrate (LOPRESSOR ) 50 MG tablet Take 1 tablet (50 mg total) by mouth 2 (two) times daily. 03/08/22   O'Neal, Darryle Ned, MD  nitroGLYCERIN  (NITROSTAT ) 0.4 MG SL tablet Place 1 tablet (0.4 mg total) under the tongue every 5 (five) minutes as needed for chest pain. 02/05/21   Vivienne Lonni Ingle, NP  pantoprazole  (PROTONIX ) 40 MG tablet Take 1 tablet (40 mg total) by mouth 2 (two) times daily before a meal for 30 days, THEN 1 tablet (40 mg total) daily. Use this prescription first. Patient not taking: Reported on 02/20/2022 12/29/21 03/29/22  Kenard Zachary PARAS, MD    Family History    Family History  Problem Relation Age of Onset   Heart attack Father        Alive @ 39 (stenting @ 60)   Other Mother        Alive & well @ 12   Other Brother        Alive @ 67   He indicated that the status of his mother is unknown. He indicated that the status of his father is unknown. He indicated that the status of his brother is unknown.  Social History    Social History   Socioeconomic History   Marital status: Divorced    Spouse name: Not on file   Number of children: Not on file   Years of education: Not on file   Highest education level: Not on file  Occupational History   Not on file  Tobacco Use   Smoking status: Former    Current packs/day: 0.00    Average packs/day: 1 pack/day for 30.0 years (30.0 ttl pk-yrs)    Types: Cigarettes    Start date: 02/15/1975    Quit date: 02/14/2005    Years since quitting: 18.7   Smokeless tobacco: Never   Tobacco comments:    smoked 1-1/12 ppd for better part of 30 years  Substance and Sexual Activity   Alcohol use: Yes    Comment: Rare drink    Drug use: No   Sexual activity: Not on file  Other Topics Concern   Not on file  Social History Narrative   Lives in [place] by himself.  Works as an Personnel officer.   Social Drivers of Corporate investment banker Strain: Not on file  Food Insecurity: No Food Insecurity (12/27/2021)   Hunger Vital Sign    Worried About Running Out of Food in the Last Year: Never true    Ran Out of Food in the Last Year: Never true  Transportation Needs: No Transportation Needs (12/27/2021)   PRAPARE - Administrator, Civil Service (Medical): No    Lack of Transportation (Non-Medical): No  Physical Activity: Not on file  Stress: Not on file  Social Connections: Not on file  Intimate Partner Violence: Not At Risk (12/27/2021)   Humiliation, Afraid, Rape, and Kick questionnaire    Fear of Current or Ex-Partner: No    Emotionally Abused: No    Physically Abused: No    Sexually Abused: No     Review of Systems    General:  No chills, fever, night sweats or weight changes.  Cardiovascular:  No chest pain, dyspnea on exertion, edema, orthopnea, palpitations, paroxysmal nocturnal dyspnea. Dermatological: No rash, lesions/masses Respiratory: No cough, dyspnea Urologic: No hematuria, dysuria Abdominal:   No nausea, vomiting, diarrhea, bright red blood per rectum, melena, or hematemesis Neurologic:  No visual changes, wkns, changes in mental status. All other systems reviewed and are otherwise negative except as noted above.  Physical Exam    VS:  BP 110/74   Pulse 68   Ht 5' 11 (1.803 m)   Wt 223 lb 9.6 oz (101.4 kg)   SpO2 98%   BMI 31.19 kg/m  , BMI Body mass index is 31.19 kg/m. GEN: Well nourished, well developed, in no acute distress. HEENT: normal. Neck: Supple, no JVD, carotid bruits, or masses. Cardiac: RRR, no murmurs, rubs, or gallops. No clubbing, cyanosis, edema.  Radials/DP/PT 2+ and equal bilaterally.  Respiratory:  Respirations regular and unlabored, clear to  auscultation bilaterally. GI: Soft, nontender, nondistended, BS + x 4. MS: no deformity or atrophy. Skin: warm and dry, no rash. Neuro:  Strength and sensation are intact. Psych: Normal affect.  Accessory Clinical Findings    Recent Labs: 06/28/2023: ALT 47 08/28/2023: BUN 18; Creatinine, Ser 1.58; Potassium 4.1; Sodium 141 09/27/2023: Hemoglobin 14.5; Platelet Count 229   Recent Lipid Panel    Component Value Date/Time   CHOL 125 07/21/2021 0811   TRIG 58 07/21/2021 0811   HDL 46 07/21/2021 0811   CHOLHDL 2.7 07/21/2021 0811   CHOLHDL 4.1 02/04/2021 0127   VLDL 28 02/04/2021 0127   LDLCALC 66 07/21/2021 0811         ECG personally reviewed by me today-none today.     Echocardiogram 02/04/2021 IMPRESSIONS     1. Left ventricular ejection fraction, by estimation, is 55 to 60%. The  left ventricle has normal function. The left ventricle has no regional  wall motion abnormalities. Left ventricular diastolic parameters were  normal.   2. Right ventricular systolic function is normal. The right ventricular  size is normal. There is normal pulmonary artery systolic pressure.   3. Left atrial size was moderately dilated.   4. The mitral valve is abnormal. Mild mitral valve regurgitation. No  evidence of mitral stenosis.   5. The aortic valve is tricuspid. There is mild calcification of the  aortic valve. Aortic valve regurgitation is not visualized. Aortic valve  sclerosis is present, with no evidence  of aortic valve stenosis.   6. The inferior vena cava is normal in size with greater than 50%  respiratory variability, suggesting right atrial pressure of 3 mmHg.   FINDINGS   Left Ventricle: Left ventricular ejection fraction, by estimation, is 55  to 60%. The left ventricle has normal function. The left ventricle has no  regional wall motion abnormalities. The left ventricular internal cavity  size was normal in size. There is   no left ventricular hypertrophy. Left  ventricular diastolic parameters  were normal.   Right Ventricle: The right ventricular size is normal. No increase in  right ventricular wall thickness. Right ventricular systolic function is  normal. There is normal pulmonary artery systolic pressure. The tricuspid  regurgitant velocity is 2.25 m/s, and   with an assumed right atrial pressure of 3 mmHg, the estimated right  ventricular systolic pressure is 23.2 mmHg.   Left Atrium: Left atrial size was moderately dilated.   Right Atrium: Right atrial size was normal in size.   Pericardium: There is no evidence of pericardial effusion.   Mitral Valve: The mitral valve is abnormal. There is mild thickening of  the mitral valve leaflet(s). Mild mitral valve regurgitation. No evidence  of mitral valve stenosis.   Tricuspid Valve: The tricuspid valve is normal in structure. Tricuspid  valve regurgitation is not demonstrated. No evidence of tricuspid  stenosis.   Aortic Valve: The aortic valve is tricuspid. There is mild calcification  of the aortic valve. Aortic valve regurgitation is not visualized. Aortic  valve sclerosis is present, with no evidence of aortic valve stenosis.  Aortic valve mean gradient measures  5.0 mmHg. Aortic valve peak gradient measures 9.7 mmHg. Aortic valve area,  by VTI measures 3.04 cm.   Pulmonic Valve: The pulmonic valve was normal in structure. Pulmonic valve  regurgitation is not visualized. No evidence of pulmonic stenosis.   Aorta: The aortic root is normal in size and structure.   Venous: The inferior vena cava is normal in size with greater than 50%  respiratory variability, suggesting right atrial pressure of 3 mmHg.   IAS/Shunts: No atrial level shunt detected by color flow Doppler.    Nuclear stress test 09/04/2023    The study is normal. The study is low risk.   No ST deviation was noted.   LV perfusion is normal. There is no evidence of ischemia. There is no evidence of infarction.    Left ventricular function is normal. Nuclear stress EF: 65%. The left ventricular ejection fraction is normal (55-65%). End diastolic cavity size is normal. End systolic cavity size is normal. No evidence of transient ischemic dilation (TID) noted.   CT images were obtained for attenuation correction and were examined for the presence of coronary calcium  when appropriate.   Coronary calcium  assessment not performed due to prior revascularization.   Prior study not available for comparison.   Assessment & Plan   1.  Coronary artery disease-no chest pain today.  Denies episodes of exertional chest discomfort.  LHC PCI to his mid circumflex and mid LAD in 2022.  Nuclear stress test 6/25 showed low risk and no ischemia.  EF was noted to be 65%.  Details above.   Continue aspirin , Repatha , metoprolol , Imdur  Patient reassured.  Essential hypertension-BP today initially 142/76 and on recheck 110/74.  Continues to be somewhat elevated at home.  Reports systolic blood pressures in the 140s-130s Increase amlodipine  to 5 mg daily Continue low-sodium diet Maintain blood pressure log   Hyperlipidemia-LDL  61 on 11/01/2022.  Continues to be compliant with Repatha . High-fiber diet Continue Repatha  Increase physical activity as tolerated Repeat fasting lipids and LFTs  Disposition: Follow-up with Dr. Barbaraann or me in 6 months.     Josefa HERO. Oluwadara Gorman NP-C     11/27/2023, 8:52 AM [facility] [address] Office [phone] Fax [phone]    I spent 14 minutes examining this patient, reviewing medications, and using patient centered shared decision making involving their cardiac care.   I spent  20 minutes reviewing past medical history,  medications, and prior cardiac tests.

## 2023-11-27 ENCOUNTER — Ambulatory Visit: Attending: General Practice | Admitting: General Practice

## 2023-11-27 ENCOUNTER — Encounter: Payer: Self-pay | Admitting: General Practice

## 2023-11-27 VITALS — BP 110/74 | HR 68 | Ht 71.0 in | Wt 223.6 lb

## 2023-11-27 DIAGNOSIS — E782 Mixed hyperlipidemia: Secondary | ICD-10-CM | POA: Diagnosis present

## 2023-11-27 DIAGNOSIS — I2510 Atherosclerotic heart disease of native coronary artery without angina pectoris: Secondary | ICD-10-CM | POA: Insufficient documentation

## 2023-11-27 DIAGNOSIS — I10 Essential (primary) hypertension: Secondary | ICD-10-CM | POA: Diagnosis not present

## 2023-11-27 MED ORDER — AMLODIPINE BESYLATE 5 MG PO TABS: 5.0000 mg | ORAL_TABLET | Freq: Every day | ORAL | 3 refills | Status: AC

## 2023-11-27 NOTE — Patient Instructions (Addendum)
 Medication Instructions:  Your physician has recommended you make the following change in your medication:  INCREASE AMLODIPINE  TO 5 MG DAILY.   *If you need a refill on your cardiac medications before your next appointment, please call your pharmacy*  Lab Work: NONE If you have labs (blood work) drawn today and your tests are completely normal, you will receive your results only by: MyChart Message (if you have MyChart) OR A paper copy in the mail If you have any lab test that is abnormal or we need to change your treatment, we will call you to review the results.  Testing/Procedures: NONE  Follow-Up: At [facility], you and your health needs are our priority.  As part of our continuing mission to provide you with exceptional heart care, our providers are all part of one team.  This team includes your primary Cardiologist (physician) and Advanced Practice Providers or APPs (Physician Assistants and Nurse Practitioners) who all work together to provide you with the care you need, when you need it.  Your next appointment:   6 month(s)  Provider:   Darryle ONEIDA Decent, MD or Josefa Beauvais, NP       Other Instructions

## 2024-01-28 ENCOUNTER — Other Ambulatory Visit (HOSPITAL_COMMUNITY): Payer: Self-pay

## 2024-01-28 ENCOUNTER — Telehealth: Payer: Self-pay | Admitting: Pharmacy Technician

## 2024-01-28 NOTE — Telephone Encounter (Signed)
 Pharmacy Patient Advocate Encounter  Received notification from [employer] that Prior Authorization for Repatha  has been DENIED.  Full denial letter will be uploaded to the media tab. See denial reason below.   PA #/Case ID/Reference #: [id number]

## 2024-01-28 NOTE — Telephone Encounter (Signed)
 Pharmacy Patient Advocate Encounter   Received notification from Fax that prior authorization for repatha  is required/requested.   Insurance verification completed.   The patient is insured through [employer].   Per test claim: PA required; PA submitted to above mentioned insurance via Latent Key/confirmation #/EOC [facility] Status is pending

## 2024-01-30 ENCOUNTER — Telehealth: Payer: Self-pay | Admitting: Pharmacy Technician

## 2024-01-30 ENCOUNTER — Other Ambulatory Visit (HOSPITAL_COMMUNITY): Payer: Self-pay

## 2024-01-30 NOTE — Telephone Encounter (Signed)
 Pharmacy Patient Advocate Encounter   Received notification from Physician's Office that prior authorization for Praluent is required/requested.   Insurance verification completed.   The patient is insured through [employer].   Per test claim: PA required; PA submitted to above mentioned insurance via Latent Key/confirmation #/EOC [id number] Status is pending

## 2024-01-30 NOTE — Telephone Encounter (Signed)
 Pharmacy Patient Advocate Encounter  Received notification from [facility] that Prior Authorization for praluent has been APPROVED from 01/30/24 to 03/12/2098. Ran test claim, Copay is $0.00- one month. This test claim was processed through [facility]- copay amounts may vary at other pharmacies due to pharmacy/plan contracts, or as the patient moves through the different stages of their insurance plan.   PA #/Case ID/Reference #: [id number]

## 2024-01-31 ENCOUNTER — Other Ambulatory Visit (HOSPITAL_COMMUNITY): Payer: Self-pay

## 2024-01-31 MED ORDER — PRALUENT 150 MG/ML ~~LOC~~ SOAJ
150.0000 mg | SUBCUTANEOUS | 4 refills | Status: AC
  Filled 2024-01-31 – 2024-02-01 (×2): qty 6, 84d supply, fill #0

## 2024-01-31 NOTE — Addendum Note (Signed)
 Addended by: LORRENE FEDERICO CROME on: 01/31/2024 04:52 PM   Modules accepted: Orders

## 2024-01-31 NOTE — Telephone Encounter (Signed)
 Spoke with pt to make him aware of change to his medication insurance preference on medication. His insurance has approved PA to fill Praluent instead of Repatha . Pt expresses his frustration for this change. Explained that test claim showed a $0 co-pay. Prescription sent to [facility] at [facility]. Pt verbalizes understanding.

## 2024-02-01 ENCOUNTER — Other Ambulatory Visit: Payer: Self-pay

## 2024-02-01 ENCOUNTER — Other Ambulatory Visit (HOSPITAL_COMMUNITY): Payer: Self-pay

## 2024-02-12 ENCOUNTER — Other Ambulatory Visit (HOSPITAL_COMMUNITY): Payer: Self-pay

## 2024-03-18 ENCOUNTER — Inpatient Hospital Stay: Attending: Oncology

## 2024-03-18 DIAGNOSIS — C9210 Chronic myeloid leukemia, BCR/ABL-positive, not having achieved remission: Secondary | ICD-10-CM

## 2024-03-18 LAB — CBC WITH DIFFERENTIAL (CANCER CENTER ONLY)
Abs Immature Granulocytes: 0.01 K/uL (ref 0.00–0.07)
Basophils Absolute: 0.1 K/uL (ref 0.0–0.1)
Basophils Relative: 1 %
Eosinophils Absolute: 0.5 K/uL (ref 0.0–0.5)
Eosinophils Relative: 7 %
HCT: 42.5 % (ref 39.0–52.0)
Hemoglobin: 14.9 g/dL (ref 13.0–17.0)
Immature Granulocytes: 0 %
Lymphocytes Relative: 30 %
Lymphs Abs: 2.1 K/uL (ref 0.7–4.0)
MCH: 29.4 pg (ref 26.0–34.0)
MCHC: 35.1 g/dL (ref 30.0–36.0)
MCV: 84 fL (ref 80.0–100.0)
Monocytes Absolute: 0.7 K/uL (ref 0.1–1.0)
Monocytes Relative: 9 %
Neutro Abs: 3.8 K/uL (ref 1.7–7.7)
Neutrophils Relative %: 53 %
Platelet Count: 243 K/uL (ref 150–400)
RBC: 5.06 MIL/uL (ref 4.22–5.81)
RDW: 14.5 % (ref 11.5–15.5)
WBC Count: 7.2 K/uL (ref 4.0–10.5)
nRBC: 0 % (ref 0.0–0.2)

## 2024-03-25 LAB — BCR-ABL1, CML/ALL, PCR, QUANT
E1A2 Transcript: <0.0032 %
Interpretation (BCRAL):: NEGATIVE
b2a2 transcript: <0.0032 %
b3a2 transcript: <0.0032 %

## 2024-03-27 NOTE — Progress Notes (Unsigned)
 " [facility]  [address] [place],  [place]  [address] [phone]  Clinic Day: 09/27/2023  Referring physician: Vernon Velna SAUNDERS, MD  HISTORY OF PRESENT ILLNESS:  The patient is a 64 y.o. 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  He has been on dasatinib  50 mg daily, from which he has had a complete hematologic, cytogenetic, and molecular response.  He comes in today for routine followup.  Since his last visit, the patient has been doing well.  He continues to tolerate his dasatinib  therapy very well.  He denies having any systemic symptoms which concern him for his CML being refractory to his dasatinib .    PHYSICAL EXAM:  There were no vitals taken for this visit. Wt Readings from Last 3 Encounters:  11/27/23 223 lb 9.6 oz (101.4 kg)  09/27/23 222 lb 12.8 oz (101.1 kg)  09/04/23 221 lb (100.2 kg)   There is no height or weight on file to calculate BMI. Performance status (ECOG): 0 - Asymptomatic Physical Exam Constitutional:      Appearance: Normal appearance. He is not ill-appearing.  HENT:     Mouth/Throat:     Mouth: Mucous membranes are moist.     Pharynx: Oropharynx is clear. No oropharyngeal exudate or posterior oropharyngeal erythema.  Cardiovascular:     Rate and Rhythm: Normal rate and regular rhythm.     Heart sounds: No murmur heard.    No friction rub. No gallop.  Pulmonary:     Effort: Pulmonary effort is normal. No respiratory distress.     Breath sounds: Normal breath sounds. No wheezing, rhonchi or rales.  Abdominal:     General: Bowel sounds are normal. There is no distension.     Palpations: Abdomen is soft. There is no mass.     Tenderness: There is no abdominal tenderness.  Musculoskeletal:        General: No swelling.     Right lower leg: No edema.     Left lower leg: No edema.  Lymphadenopathy:     Cervical: No cervical adenopathy.     Upper Body:     Right upper body: No  supraclavicular or axillary adenopathy.     Left upper body: No supraclavicular or axillary adenopathy.     Lower Body: No right inguinal adenopathy. No left inguinal adenopathy.  Skin:    General: Skin is warm.     Coloration: Skin is not jaundiced.     Findings: No lesion or rash.  Neurological:     General: No focal deficit present.     Mental Status: He is alert and oriented to person, place, and time. Mental status is at baseline.  Psychiatric:        Mood and Affect: Mood normal.        Behavior: Behavior normal.        Thought Content: Thought content normal.     LABS:      Latest Ref Rng & Units 03/18/2024    1:54 PM 09/27/2023    1:50 PM 08/28/2023   11:34 AM  CBC  WBC 4.0 - 10.5 K/uL 7.2  6.9  5.7   Hemoglobin 13.0 - 17.0 g/dL 85.0  85.4  84.0   Hematocrit 39.0 - 52.0 % 42.5  41.3  46.5   Platelets 150 - 400 K/uL 243  229  237       Latest Ref Rng & Units 08/28/2023   11:34 AM  06/28/2023    6:06 PM 06/28/2023    6:03 PM  CMP  Glucose 70 - 99 mg/dL 88  861  868   BUN 8 - 27 mg/dL 18  23  20    Creatinine 0.76 - 1.27 mg/dL 8.41  8.39  8.43   Sodium 134 - 144 mmol/L 141  137  138   Potassium 3.5 - 5.2 mmol/L 4.1  4.3  4.4   Chloride 96 - 106 mmol/L 102  106  103   CO2 20 - 29 mmol/L 20   18   Calcium  8.6 - 10.2 mg/dL 9.5   89.7   Total Protein 6.5 - 8.1 g/dL   8.2   Total Bilirubin 0.0 - 1.2 mg/dL   1.1   Alkaline Phos 38 - 126 U/L   52   AST 15 - 41 U/L   47   ALT 0 - 44 U/L   47     Latest Reference Range & Units 03/18/24 13:53  b2a2 transcript % <0.0032  b3a2 transcript % <0.0032  E1A2 Transcript % <0.0032  Interpretation (BCRAL):  Negative    ASSESSMENT & PLAN:  Assessment/Plan:  A 64 y.o. male with chronic myelogenous leukemia.  I am extremely pleased as his labs continue to show he is in a complete molecular response.  This reflects the continued efficacy of his dasatinib  at 50 mg.  He will continue to take this medication as long as he remains in a  complete molecular response.  As he is doing very well from a CML perspective, I will see him back in another 6 months for repeat clinical assessment.  The patient understands all the plans discussed today and is in agreement with them.    Alzina Golda DELENA Kerns, MD      "

## 2024-03-28 ENCOUNTER — Inpatient Hospital Stay: Admitting: Oncology

## 2024-04-09 ENCOUNTER — Telehealth: Payer: Self-pay | Admitting: Cardiovascular Disease

## 2024-04-09 NOTE — Telephone Encounter (Signed)
 Error
